# Patient Record
Sex: Male | Born: 1955 | Race: Black or African American | Hispanic: No | Marital: Married | Smoking: Former smoker
Health system: Southern US, Community
[De-identification: ages and names within clinical notes are randomized; demographics above are authoritative.]

## PROBLEM LIST (undated history)

## (undated) DIAGNOSIS — M1990 Unspecified osteoarthritis, unspecified site: Secondary | ICD-10-CM

## (undated) DIAGNOSIS — E785 Hyperlipidemia, unspecified: Secondary | ICD-10-CM

## (undated) DIAGNOSIS — I639 Cerebral infarction, unspecified: Secondary | ICD-10-CM

## (undated) DIAGNOSIS — I10 Essential (primary) hypertension: Secondary | ICD-10-CM

## (undated) DIAGNOSIS — R7303 Prediabetes: Secondary | ICD-10-CM

## (undated) DIAGNOSIS — Z89511 Acquired absence of right leg below knee: Secondary | ICD-10-CM

## (undated) DIAGNOSIS — G8929 Other chronic pain: Secondary | ICD-10-CM

## (undated) DIAGNOSIS — K219 Gastro-esophageal reflux disease without esophagitis: Secondary | ICD-10-CM

## (undated) DIAGNOSIS — D696 Thrombocytopenia, unspecified: Secondary | ICD-10-CM

## (undated) DIAGNOSIS — H9313 Tinnitus, bilateral: Secondary | ICD-10-CM

## (undated) HISTORY — PX: BELOW KNEE LEG AMPUTATION: SUR23

---

## 2006-03-13 ENCOUNTER — Ambulatory Visit: Payer: Self-pay | Admitting: Gastroenterology

## 2009-09-05 ENCOUNTER — Ambulatory Visit: Payer: Self-pay | Admitting: Internal Medicine

## 2009-09-10 ENCOUNTER — Ambulatory Visit: Payer: Self-pay | Admitting: Internal Medicine

## 2011-09-06 ENCOUNTER — Emergency Department (INDEPENDENT_AMBULATORY_CARE_PROVIDER_SITE_OTHER): Payer: No Typology Code available for payment source

## 2011-09-06 ENCOUNTER — Encounter: Payer: Self-pay | Admitting: *Deleted

## 2011-09-06 ENCOUNTER — Emergency Department (HOSPITAL_BASED_OUTPATIENT_CLINIC_OR_DEPARTMENT_OTHER)
Admission: EM | Admit: 2011-09-06 | Discharge: 2011-09-07 | Disposition: A | Discharge status: Home / Self Care | Payer: No Typology Code available for payment source | Attending: Emergency Medicine | Admitting: Emergency Medicine

## 2011-09-06 DIAGNOSIS — S20219A Contusion of unspecified front wall of thorax, initial encounter: Secondary | ICD-10-CM | POA: Insufficient documentation

## 2011-09-06 DIAGNOSIS — R079 Chest pain, unspecified: Secondary | ICD-10-CM

## 2011-09-06 DIAGNOSIS — Y92410 Unspecified street and highway as the place of occurrence of the external cause: Secondary | ICD-10-CM | POA: Insufficient documentation

## 2011-09-06 MED ORDER — IBUPROFEN 800 MG PO TABS
800.0000 mg | ORAL_TABLET | Freq: Once | ORAL | Status: AC
  Administered 2011-09-06: 800 mg via ORAL
  Filled 2011-09-06: qty 1

## 2011-09-06 MED ORDER — HYDROCODONE-ACETAMINOPHEN 5-325 MG PO TABS
1.0000 | ORAL_TABLET | Freq: Once | ORAL | Status: AC
  Administered 2011-09-06: 1 via ORAL
  Filled 2011-09-06: qty 1

## 2011-09-06 MED ORDER — HYDROCODONE-ACETAMINOPHEN 5-325 MG PO TABS: ORAL_TABLET | ORAL | Status: DC
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
## 2011-09-07 NOTE — ED Provider Notes (Signed)
Medical screening examination/treatment/procedure(s) were performed by non-physician practitioner and as supervising physician I was immediately available for consultation/collaboration.   Rolan Bucco, MD 09/07/11 [phone]

## 2012-04-15 ENCOUNTER — Ambulatory Visit: Payer: Self-pay | Admitting: Podiatry

## 2012-04-15 HISTORY — PX: OSTEOTOMY: SHX137

## 2012-08-16 ENCOUNTER — Ambulatory Visit: Payer: Self-pay | Admitting: Unknown Physician Specialty

## 2013-02-08 DIAGNOSIS — I639 Cerebral infarction, unspecified: Secondary | ICD-10-CM

## 2013-02-08 HISTORY — DX: Cerebral infarction, unspecified: I63.9

## 2013-03-15 ENCOUNTER — Ambulatory Visit: Payer: Self-pay | Admitting: Internal Medicine

## 2013-09-03 IMAGING — US US CAROTID DUPLEX BILAT
1 series · 14 of 24 positions shown · non-contrast
Comparison: none

REASON FOR EXAM: R sided weakness and R sided numbness in face eval for
CVA
COMMENTS:

PROCEDURE:     US  - US CAROTID DOPPLER BILATERAL  - March 15, 2013  [DATE]
RESULT:     Comparison: None
TECHNIQUE: Gray-scale, color Doppler, and spectral Doppler images were
obtained of the extracranial carotid artery systems and vertebral arteries
in the neck.

[Series 1: us carotid duplex bilat · 0.07mm/px · 14 of 70 slices shown]
[im 1/70]
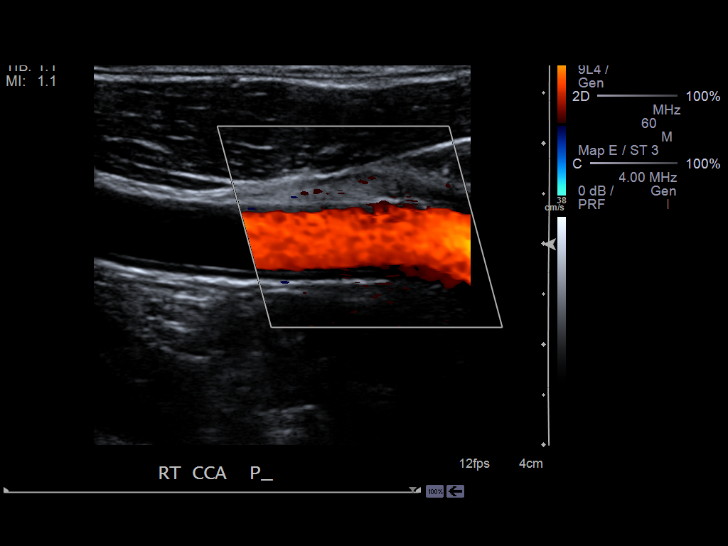
[im 7/70]
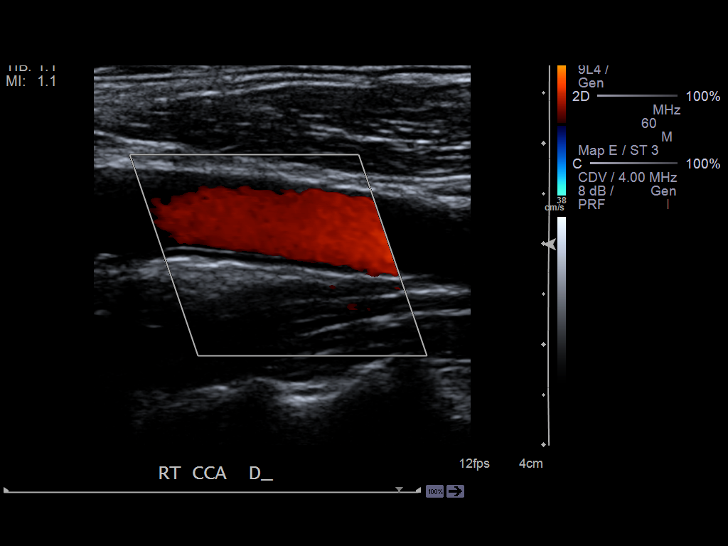
[im 13/70]
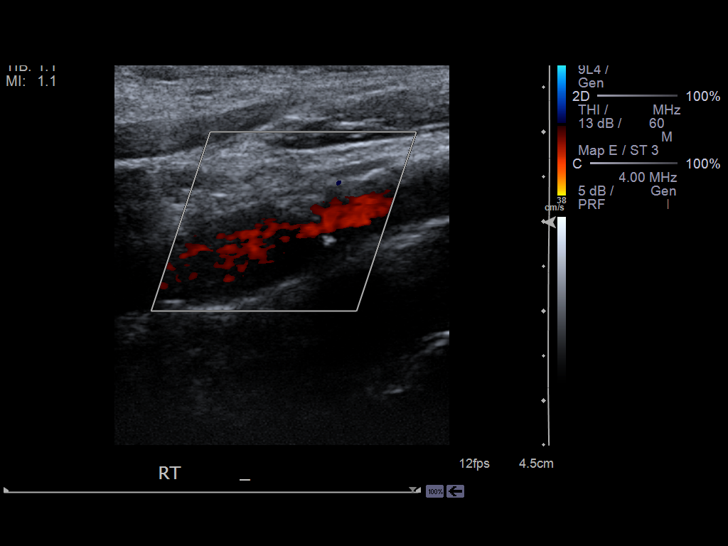
[im 19/70]
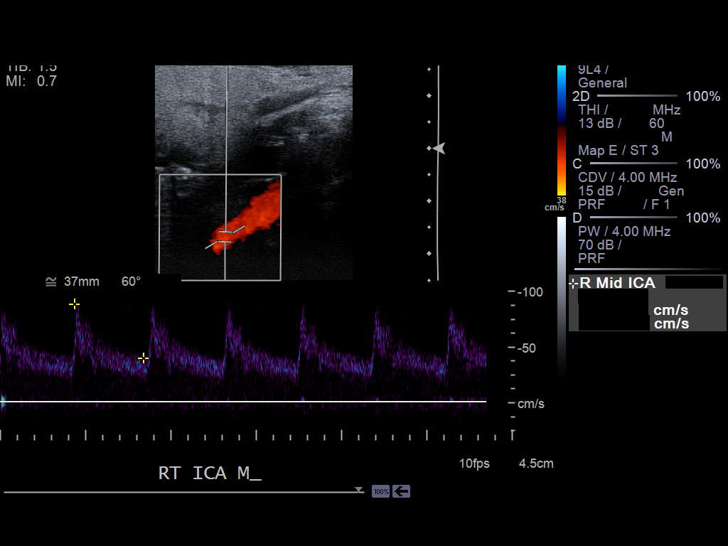
[im 22/70]
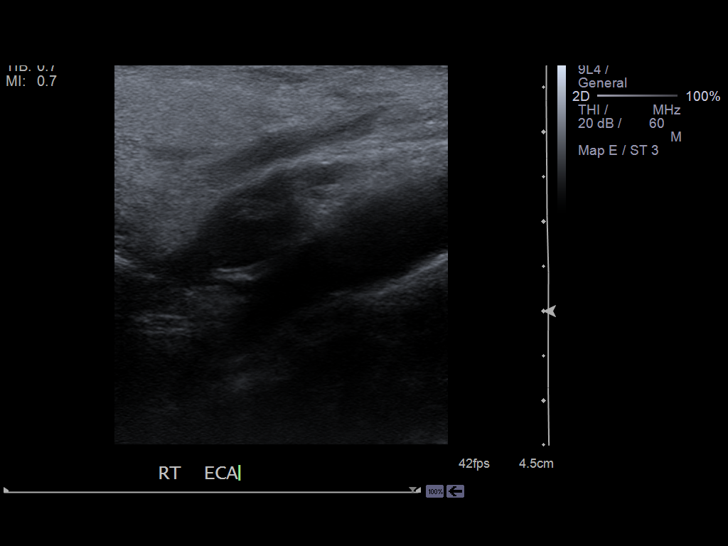
[im 28/70]
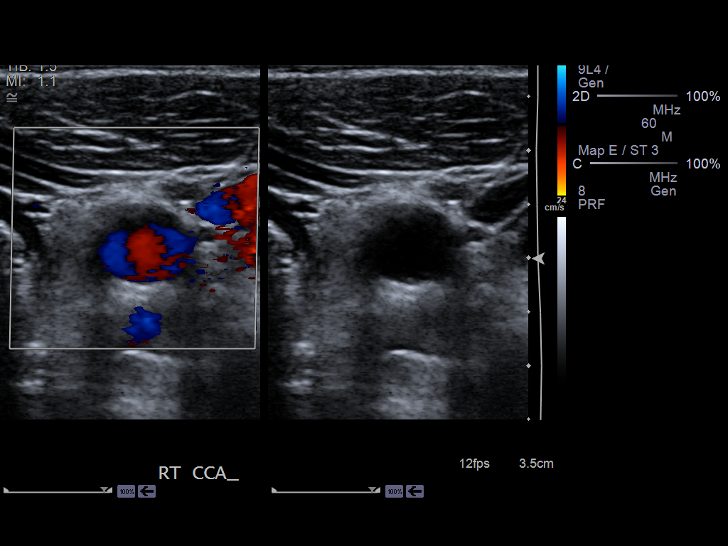
[im 34/70]
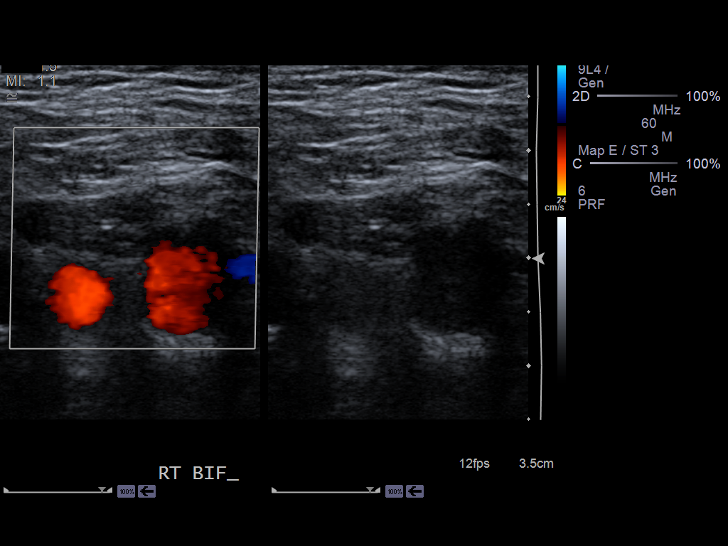
[im 37/70]
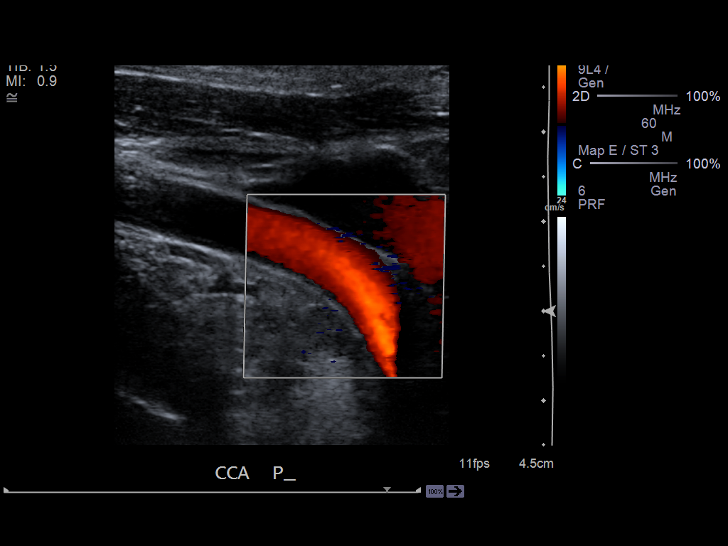
[im 43/70]
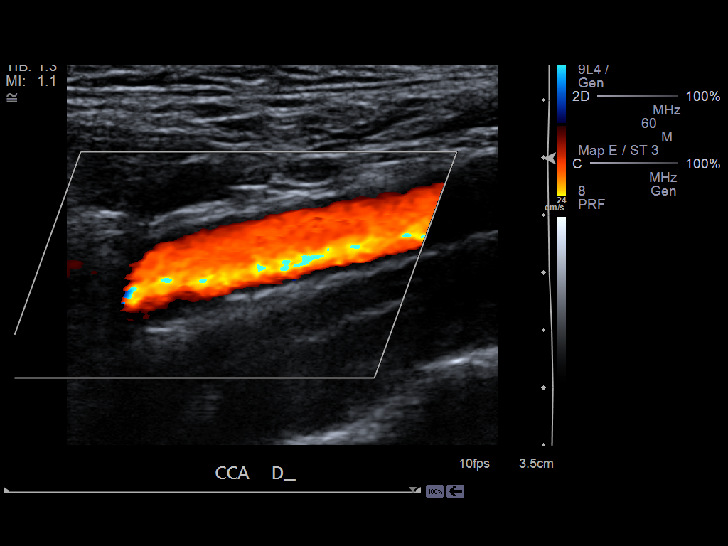
[im 49/70]
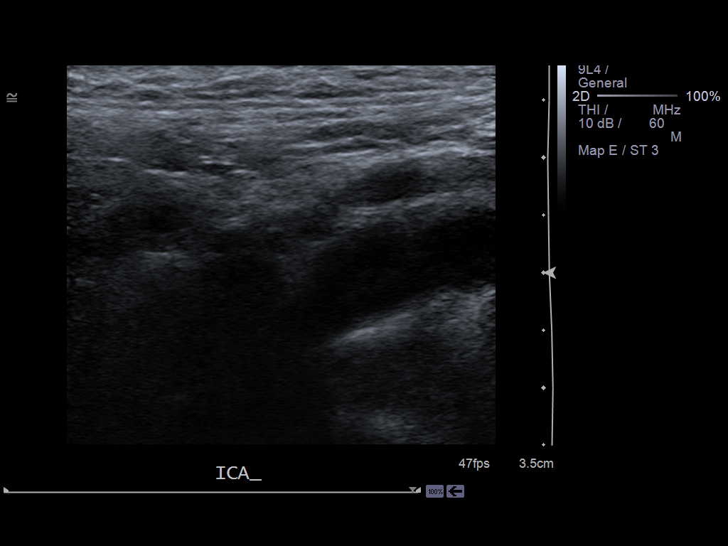
[im 55/70]
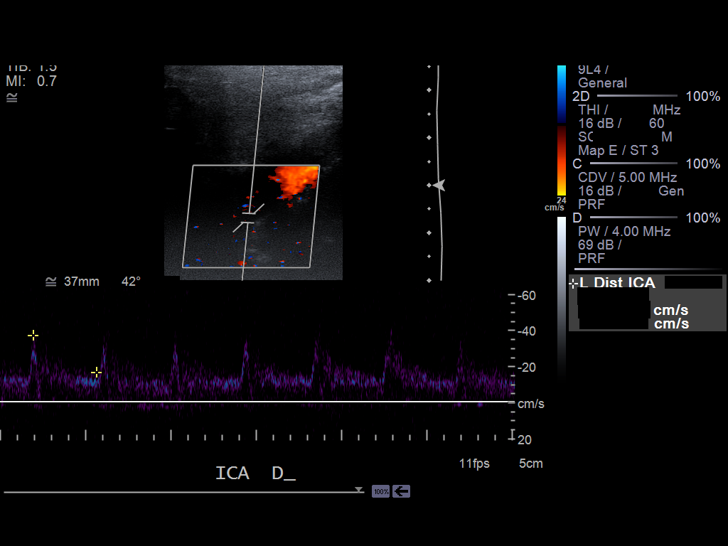
[im 58/70]
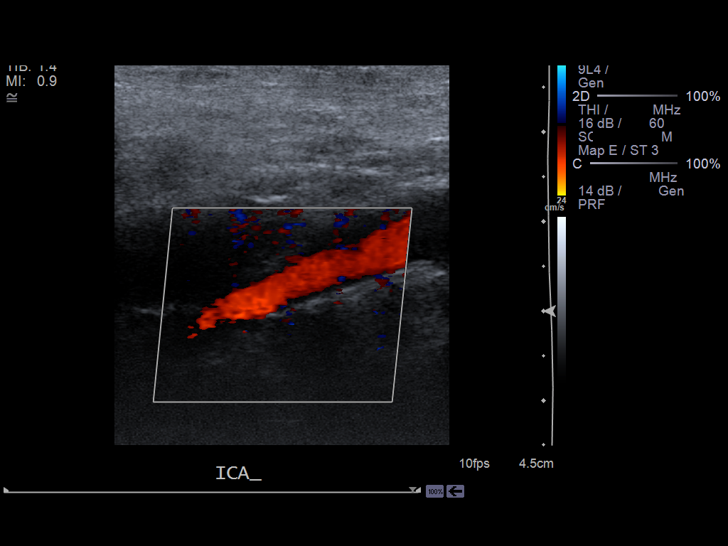
[im 64/70]
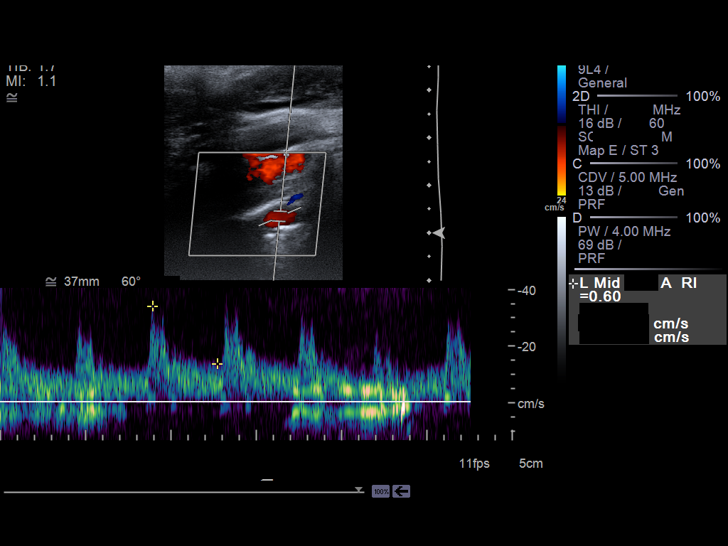
[im 70/70]
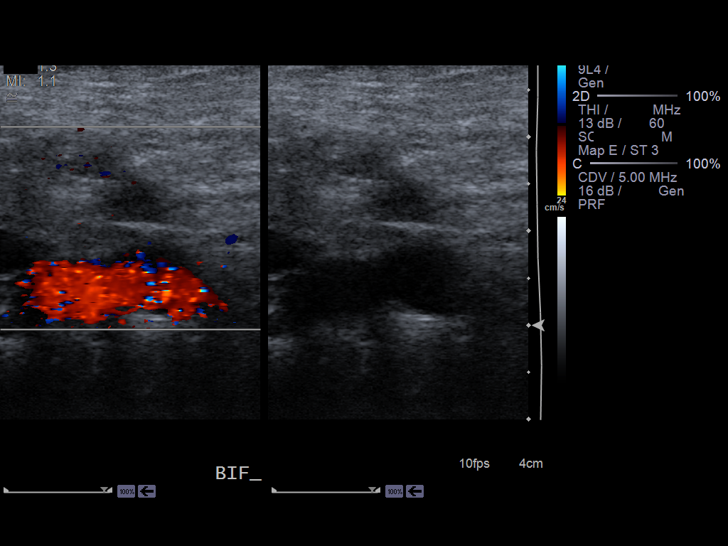

[14 of 24 positions shown; findings below may reference images not displayed]

FINDINGS: Mild atherosclerotic plaque is demonstrated in the carotid bulbs. By visual
inspection, there is less than 50% stenosis. The peak systolic velocities
are not elevated.

The vertebral arteries are patent bilaterally.
IMPRESSION: No evidence of hemodynamically significant stenosis in the extracranial
carotid arteries.

[REDACTED]

## 2017-01-16 ENCOUNTER — Encounter: Payer: Self-pay | Admitting: *Deleted

## 2017-01-19 ENCOUNTER — Ambulatory Visit
Admission: RE | Admit: 2017-01-19 | Discharge: 2017-01-19 | Disposition: A | Discharge status: Home / Self Care | Payer: BLUE CROSS/BLUE SHIELD | Source: Ambulatory Visit | Attending: Gastroenterology | Admitting: Gastroenterology

## 2017-01-19 ENCOUNTER — Ambulatory Visit: Payer: BLUE CROSS/BLUE SHIELD | Admitting: Anesthesiology

## 2017-01-19 ENCOUNTER — Encounter
Admission: RE | Disposition: A | Discharge status: Home / Self Care | Payer: Self-pay | Source: Ambulatory Visit | Attending: Gastroenterology

## 2017-01-19 ENCOUNTER — Encounter: Payer: Self-pay | Admitting: Anesthesiology

## 2017-01-19 DIAGNOSIS — K219 Gastro-esophageal reflux disease without esophagitis: Secondary | ICD-10-CM | POA: Insufficient documentation

## 2017-01-19 DIAGNOSIS — K5730 Diverticulosis of large intestine without perforation or abscess without bleeding: Secondary | ICD-10-CM | POA: Diagnosis not present

## 2017-01-19 DIAGNOSIS — Z1211 Encounter for screening for malignant neoplasm of colon: Secondary | ICD-10-CM | POA: Insufficient documentation

## 2017-01-19 DIAGNOSIS — I10 Essential (primary) hypertension: Secondary | ICD-10-CM | POA: Diagnosis not present

## 2017-01-19 DIAGNOSIS — D124 Benign neoplasm of descending colon: Secondary | ICD-10-CM | POA: Diagnosis not present

## 2017-01-19 DIAGNOSIS — Z87891 Personal history of nicotine dependence: Secondary | ICD-10-CM | POA: Insufficient documentation

## 2017-01-19 DIAGNOSIS — Z79899 Other long term (current) drug therapy: Secondary | ICD-10-CM | POA: Diagnosis not present

## 2017-01-19 DIAGNOSIS — Z7982 Long term (current) use of aspirin: Secondary | ICD-10-CM | POA: Diagnosis not present

## 2017-01-19 DIAGNOSIS — K648 Other hemorrhoids: Secondary | ICD-10-CM | POA: Insufficient documentation

## 2017-01-19 DIAGNOSIS — E785 Hyperlipidemia, unspecified: Secondary | ICD-10-CM | POA: Diagnosis not present

## 2017-01-19 DIAGNOSIS — M1990 Unspecified osteoarthritis, unspecified site: Secondary | ICD-10-CM | POA: Diagnosis not present

## 2017-01-19 DIAGNOSIS — Z8673 Personal history of transient ischemic attack (TIA), and cerebral infarction without residual deficits: Secondary | ICD-10-CM | POA: Insufficient documentation

## 2017-01-19 HISTORY — PX: COLONOSCOPY WITH PROPOFOL: SHX5780

## 2017-01-19 HISTORY — DX: Essential (primary) hypertension: I10

## 2017-01-19 HISTORY — DX: Hyperlipidemia, unspecified: E78.5

## 2017-01-19 HISTORY — DX: Cerebral infarction, unspecified: I63.9

## 2017-01-19 HISTORY — DX: Unspecified osteoarthritis, unspecified site: M19.90

## 2017-01-19 HISTORY — DX: Gastro-esophageal reflux disease without esophagitis: K21.9

## 2017-01-19 SURGERY — COLONOSCOPY WITH PROPOFOL
Anesthesia: General

## 2017-01-19 MED ORDER — LIDOCAINE HCL (PF) 1 % IJ SOLN
2.0000 mL | Freq: Once | INTRAMUSCULAR | Status: AC
  Administered 2017-01-19: 0.3 mL via INTRADERMAL

## 2017-01-19 MED ORDER — FENTANYL CITRATE (PF) 100 MCG/2ML IJ SOLN: 25.0000 ug | INTRAMUSCULAR | Status: DC | PRN

## 2017-01-19 MED ORDER — SODIUM CHLORIDE 0.9 % IV SOLN: INTRAVENOUS | Status: DC

## 2017-01-19 MED ORDER — SODIUM CHLORIDE 0.9 % IV SOLN
INTRAVENOUS | Status: DC
  Administered 2017-01-19: 1000 mL via INTRAVENOUS

## 2017-01-19 MED ORDER — PROPOFOL 10 MG/ML IV BOLUS
INTRAVENOUS | Status: DC | PRN
  Administered 2017-01-19: 80 mg via INTRAVENOUS

## 2017-01-19 MED ORDER — MIDAZOLAM HCL 2 MG/2ML IJ SOLN
INTRAMUSCULAR | Status: DC | PRN
  Administered 2017-01-19: 1 mg via INTRAVENOUS

## 2017-01-19 MED ORDER — MIDAZOLAM HCL 2 MG/2ML IJ SOLN
INTRAMUSCULAR | Status: AC
  Filled 2017-01-19: qty 2

## 2017-01-19 MED ORDER — LIDOCAINE HCL (PF) 2 % IJ SOLN
INTRAMUSCULAR | Status: DC | PRN
Start: 2017-01-19 — End: 2017-01-19
  Administered 2017-01-19: 40 mg via INTRADERMAL

## 2017-01-19 MED ORDER — PROPOFOL 500 MG/50ML IV EMUL
INTRAVENOUS | Status: DC | PRN
  Administered 2017-01-19: 150 ug/kg/min via INTRAVENOUS

## 2017-01-19 MED ORDER — SODIUM CHLORIDE 0.9 % IJ SOLN
INTRAMUSCULAR | Status: AC
  Filled 2017-01-19: qty 10

## 2017-01-19 MED ORDER — EPHEDRINE SULFATE 50 MG/ML IJ SOLN
INTRAMUSCULAR | Status: DC | PRN
  Administered 2017-01-19: 5 mg via INTRAVENOUS

## 2017-01-19 MED ORDER — PROPOFOL 500 MG/50ML IV EMUL
INTRAVENOUS | Status: AC
  Filled 2017-01-19: qty 50

## 2017-01-19 MED ORDER — LIDOCAINE HCL (PF) 1 % IJ SOLN
INTRAMUSCULAR | Status: AC
  Administered 2017-01-19: 0.3 mL via INTRADERMAL
  Filled 2017-01-19: qty 2

## 2017-01-19 MED ORDER — PROPOFOL 500 MG/50ML IV EMUL
INTRAVENOUS | Status: AC
Start: 2017-01-19 — End: 2017-01-19
  Filled 2017-01-19: qty 50

## 2017-01-19 MED ORDER — ONDANSETRON HCL 4 MG/2ML IJ SOLN: 4.0000 mg | Freq: Once | INTRAMUSCULAR | Status: DC | PRN

## 2017-01-19 NOTE — H&P (Signed)
Outpatient short stay form Pre-procedure 01/19/2017 11:54 AM Lollie Sails MD  Primary Physician:   Reason for visit:    History of present illness:    Current Facility-Administered Medications:  .  0.9 %  sodium chloride infusion, , Intravenous, Continuous, Lollie Sails, MD  Prescriptions Prior to Admission  Medication Sig Dispense Refill Last Dose  . aspirin EC 81 MG tablet Take 81 mg by mouth daily.     Marland Kitchen atorvastatin (LIPITOR) 10 MG tablet Take 10 mg by mouth daily at 6 PM.     . meloxicam (MOBIC) 15 MG tablet Take 15 mg by mouth daily. As needed     . ranitidine (ZANTAC) 300 MG capsule Take 300 mg by mouth daily.     Marland Kitchen esomeprazole (NEXIUM) 40 MG capsule Take 40 mg by mouth daily as needed. For acid reflux    09/06/2011 at Unknown  . HYDROcodone-acetaminophen (NORCO) 5-325 MG per tablet One po q 4-6 hrs prn pain 20 tablet 0   . ibuprofen (ADVIL,MOTRIN) 600 MG tablet Take 600 mg by mouth every 6 (six) hours as needed. For pain    Past Week at Unknown  . ibuprofen (ADVIL,MOTRIN) 800 MG tablet Take 800 mg by mouth every 8 (eight) hours as needed. For pain    Past Week at Unknown     No Known Allergies   Past Medical History:  Diagnosis Date  . CVA (cerebral vascular accident) ([place]) 02/2013   Slight right lacunar CVA manifested by left upper and lower exteremity paresthesias. MRI of brain showed small vessel disease only. No obvious CVA. Ultrasound of carotids showed insignificant disease.  Marland Kitchen GERD (gastroesophageal reflux disease)   . Hyperlipidemia   . Hypertension   . Osteoarthritis    Degenerative arthritis medial compartment of the left knee    Review of systems:      Physical Exam    Heart and lungs    HEENT    Other    Pertinant exam for procedure:     Planned proceedures: See other note this date for H&P    Lollie Sails, MD Gastroenterology 01/19/2017  11:54 AM

## 2017-01-19 NOTE — Anesthesia Postprocedure Evaluation (Signed)
Anesthesia Post Note  Patient: Juan Scott  Procedure(s) Performed: Procedure(s) (LRB): COLONOSCOPY WITH PROPOFOL (N/A)  Patient location during evaluation: PACU Anesthesia Type: General Level of consciousness: awake and alert and oriented Pain management: pain level controlled Vital Signs Assessment: post-procedure vital signs reviewed and stable Respiratory status: spontaneous breathing Cardiovascular status: blood pressure returned to baseline Anesthetic complications: no     Last Vitals:  Vitals:   01/19/17 1348 01/19/17 1358  BP: 122/82 121/77  Pulse: 75 (!) 59  Resp: 19 12  Temp:      Last Pain:  Vitals:   01/19/17 1329  TempSrc: Tympanic  PainSc:                  Tanaysha Alkins

## 2017-01-19 NOTE — Anesthesia Post-op Follow-up Note (Cosign Needed)
Anesthesia QCDR form completed.        

## 2017-01-19 NOTE — Op Note (Signed)
[facility] Gastroenterology Patient Name: Juan Scott Procedure Date: 01/19/2017 12:24 PM MRN: [id number] Account #: [id number] Date of Birth: 12-03-1955 Admit Type: Outpatient Age: 61 Room: [facility] ENDO ROOM 1 Gender: Male Note Status: Finalized Procedure:            Colonoscopy Indications:          Screening for colorectal malignant neoplasm Providers:            Lollie Sails, MD Referring MD:         Caprice Renshaw MD (Referring MD) Medicines:            Monitored Anesthesia Care Complications:        No immediate complications. Procedure:            Pre-Anesthesia Assessment:                       - ASA Grade Assessment: III - A patient with severe                        systemic disease.                       After obtaining informed consent, the colonoscope was                        passed under direct vision. Throughout the procedure,                        the patient's blood pressure, pulse, and oxygen                        saturations were monitored continuously. The                        Colonoscope was introduced through the anus and                        advanced to the the cecum, identified by appendiceal                        orifice and ileocecal valve. The colonoscopy was                        performed without difficulty. The patient tolerated the                        procedure well. The quality of the bowel preparation                        was good. Findings:      Multiple medium-mouthed diverticula were found in the ascending colon.      A 16 mm polyp was found in the descending colon. The polyp was       pedunculated. The polyp was removed with a hot snare. Resection and       retrieval ( with a net) were complete. To prevent bleeding after the       polypectomy, one hemostatic clip was successfully placed. There was no       bleeding at the end of the maneuver.      Non-bleeding internal hemorrhoids were found during  anoscopy.  The retroflexed view of the distal rectum and anal verge was normal and       showed no anal or rectal abnormalities.      The digital rectal exam was normal. Impression:           - Diverticulosis in the ascending colon.                       - One 16 mm polyp in the descending colon, removed with                        a hot snare. Resected and retrieved.                       - Non-bleeding internal hemorrhoids.                       - The distal rectum and anal verge are normal on                        retroflexion view. Recommendation:       - Discharge patient to home.                       - Await pathology results. Procedure Code(s):    --- Professional ---                       [phone], Colonoscopy, flexible; with removal of tumor(s),                        polyp(s), or other lesion(s) by snare technique Diagnosis Code(s):    --- Professional ---                       Z12.11, Encounter for screening for malignant neoplasm                        of colon                       K64.8, Other hemorrhoids                       D12.4, Benign neoplasm of descending colon                       K57.30, Diverticulosis of large intestine without                        perforation or abscess without bleeding CPT copyright 2016 American Medical Association. All rights reserved. The codes documented in this report are preliminary and upon coder review may  be revised to meet current compliance requirements. Lollie Sails, MD 01/19/2017 1:27:59 PM This report has been signed electronically. Number of Addenda: 0 Note Initiated On: 01/19/2017 12:24 PM Scope Withdrawal Time: 0 hours 24 minutes 29 seconds  Total Procedure Duration: 0 hours 33 minutes 16 seconds       [facility]

## 2017-01-19 NOTE — Transfer of Care (Signed)
Immediate Anesthesia Transfer of Care Note  Patient: Juan Scott  Procedure(s) Performed: Procedure(s): COLONOSCOPY WITH PROPOFOL (N/A)  Patient Location: PACU  Anesthesia Type:General  Level of Consciousness: awake, alert  and oriented  Airway & Oxygen Therapy: Patient Spontanous Breathing and Patient connected to nasal cannula oxygen  Post-op Assessment: Report given to RN and Post -op Vital signs reviewed and stable  Post vital signs: Reviewed and stable  Last Vitals:  Vitals:   01/19/17 1328 01/19/17 1329  BP: 115/68 115/68  Pulse: 69 66  Resp: (!) 22 (!) 23  Temp: 36.4 C (!) 35.8 C    Last Pain:  Vitals:   01/19/17 1329  TempSrc: Tympanic  PainSc:          Complications: No apparent anesthesia complications

## 2017-01-19 NOTE — H&P (Signed)
Outpatient short stay form Pre-procedure 01/19/2017 12:41 PM Lollie Sails MD  Primary Physician: Dr Derinda Late  Reason for visit:  Colonoscopy  History of present illness:  Patient is a 61 year old male presenting today as above. He tolerated his prep well. He takes no aspirin with the exception of 81 mg aspirin. He takes no blood thinning agents.    Current Facility-Administered Medications:  .  0.9 %  sodium chloride infusion, , Intravenous, Continuous, Lollie Sails, MD .  0.9 %  sodium chloride infusion, , Intravenous, Continuous, Lollie Sails, MD, Last Rate: 20 mL/hr at 01/19/17 1220, 1,000 mL at 01/19/17 1220 .  0.9 %  sodium chloride infusion, , Intravenous, Continuous, Lollie Sails, MD .  fentaNYL (SUBLIMAZE) injection 25 mcg, 25 mcg, Intravenous, Q5 min PRN, Alvin Critchley, MD .  ondansetron [facility]) injection 4 mg, 4 mg, Intravenous, Once PRN, Alvin Critchley, MD  Facility-Administered Medications Ordered in Other Encounters:  .  lidocaine (XYLOCAINE) 2 % injection, , , Anesthesia Intra-op, Hedda Slade, CRNA, 40 mg at 01/19/17 1241  Prescriptions Prior to Admission  Medication Sig Dispense Refill Last Dose  . aspirin EC 81 MG tablet Take 81 mg by mouth daily.   Past Month at Unknown time  . atorvastatin (LIPITOR) 10 MG tablet Take 10 mg by mouth daily at 6 PM.     . meloxicam (MOBIC) 15 MG tablet Take 15 mg by mouth daily. As needed     . ranitidine (ZANTAC) 300 MG capsule Take 300 mg by mouth daily.     Marland Kitchen esomeprazole (NEXIUM) 40 MG capsule Take 40 mg by mouth daily as needed. For acid reflux    09/06/2011 at Unknown  . HYDROcodone-acetaminophen (NORCO) 5-325 MG per tablet One po q 4-6 hrs prn pain 20 tablet 0   . ibuprofen (ADVIL,MOTRIN) 600 MG tablet Take 600 mg by mouth every 6 (six) hours as needed. For pain    01/13/2017  . ibuprofen (ADVIL,MOTRIN) 800 MG tablet Take 800 mg by mouth every 8 (eight) hours as needed. For pain    Past Week at Unknown      No Known Allergies   Past Medical History:  Diagnosis Date  . CVA (cerebral vascular accident) ([place]) 02/2013   Slight right lacunar CVA manifested by left upper and lower exteremity paresthesias. MRI of brain showed small vessel disease only. No obvious CVA. Ultrasound of carotids showed insignificant disease.  Marland Kitchen GERD (gastroesophageal reflux disease)   . Hyperlipidemia   . Hypertension   . Osteoarthritis    Degenerative arthritis medial compartment of the left knee    Review of systems:      Physical Exam    Heart and lungs: Regular rate and rhythm without rub or gallop, lungs are bilaterally clear.    HEENT: Normocephalic atraumatic eyes are anicteric    Other:     Pertinant exam for procedure: Soft nontender nondistended bowel sounds positive normoactive. It is of note that he has a palpable liver edge that extends about 2 cm below the costal margin. This is nontender.    Planned proceedures: Colonoscopy and indicated procedures.. It is of note that on physical examination he has a palpable liver edge. Review of his laboratories indicate occasional minimal elevations of bilirubin with normal transaminases. Will recommend a abdominal ultrasound and will schedule this as an outpatient with follow-up. Lollie Sails, MD Gastroenterology 01/19/2017  12:41 PM

## 2017-01-19 NOTE — Anesthesia Preprocedure Evaluation (Signed)
Anesthesia Evaluation  Patient identified by MRN, date of birth, ID band Patient awake    Reviewed: Allergy & Precautions, NPO status , Patient's Chart, lab work & pertinent test results  Airway Mallampati: II       Dental   Pulmonary former smoker,    Pulmonary exam normal        Cardiovascular hypertension, Normal cardiovascular exam     Neuro/Psych CVA negative psych ROS   GI/Hepatic Neg liver ROS, GERD  Medicated,  Endo/Other  negative endocrine ROS  Renal/GU negative Renal ROS     Musculoskeletal  (+) Arthritis , Osteoarthritis,    Abdominal Normal abdominal exam  (+)   Peds negative pediatric ROS (+)  Hematology negative hematology ROS (+)   Anesthesia Other Findings   Reproductive/Obstetrics                             Anesthesia Physical Anesthesia Plan  ASA: III  Anesthesia Plan: General   Post-op Pain Management:    Induction: Intravenous  Airway Management Planned: Nasal Cannula  Additional Equipment:   Intra-op Plan:   Post-operative Plan:   Informed Consent: I have reviewed the patients History and Physical, chart, labs and discussed the procedure including the risks, benefits and alternatives for the proposed anesthesia with the patient or authorized representative who has indicated his/her understanding and acceptance.   Dental advisory given  Plan Discussed with: CRNA and Surgeon  Anesthesia Plan Comments:         Anesthesia Quick Evaluation

## 2017-01-20 LAB — SURGICAL PATHOLOGY

## 2017-01-21 ENCOUNTER — Other Ambulatory Visit: Payer: Self-pay | Admitting: Gastroenterology

## 2017-01-21 DIAGNOSIS — R17 Unspecified jaundice: Secondary | ICD-10-CM

## 2017-01-29 ENCOUNTER — Ambulatory Visit: Payer: BLUE CROSS/BLUE SHIELD

## 2017-02-02 ENCOUNTER — Ambulatory Visit
Admission: RE | Admit: 2017-02-02 | Discharge: 2017-02-02 | Disposition: A | Discharge status: Home / Self Care | Payer: BLUE CROSS/BLUE SHIELD | Source: Ambulatory Visit | Attending: Gastroenterology | Admitting: Gastroenterology

## 2017-02-02 DIAGNOSIS — R17 Unspecified jaundice: Secondary | ICD-10-CM | POA: Insufficient documentation

## 2017-02-02 DIAGNOSIS — I77811 Abdominal aortic ectasia: Secondary | ICD-10-CM | POA: Diagnosis not present

## 2017-02-02 DIAGNOSIS — K8020 Calculus of gallbladder without cholecystitis without obstruction: Secondary | ICD-10-CM | POA: Diagnosis not present

## 2018-09-22 IMAGING — US US ABDOMEN COMPLETE
1 series · 14 of 25 positions shown · non-contrast
Comparison: No recent prior.

CLINICAL DATA: Elevated bilirubin.

EXAM:
ABDOMEN ULTRASOUND COMPLETE

[Series 1: us abdomen complete · 0.23mm/px · 14 of 100 slices shown]
[im 1/100]
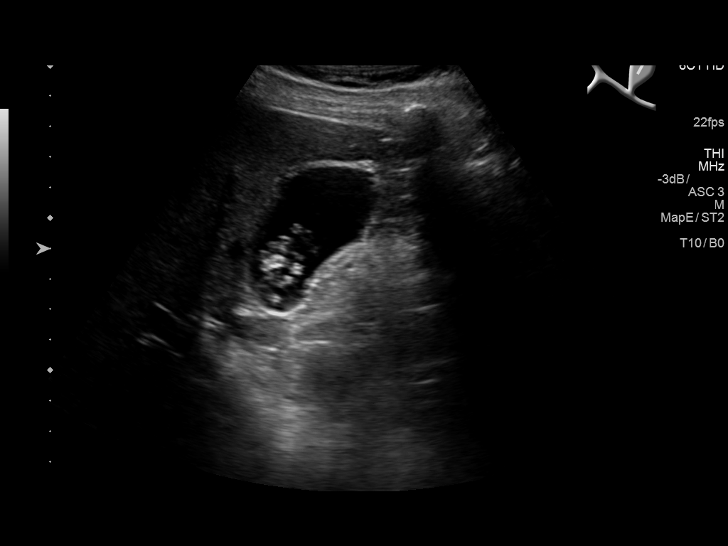
[im 9/100]
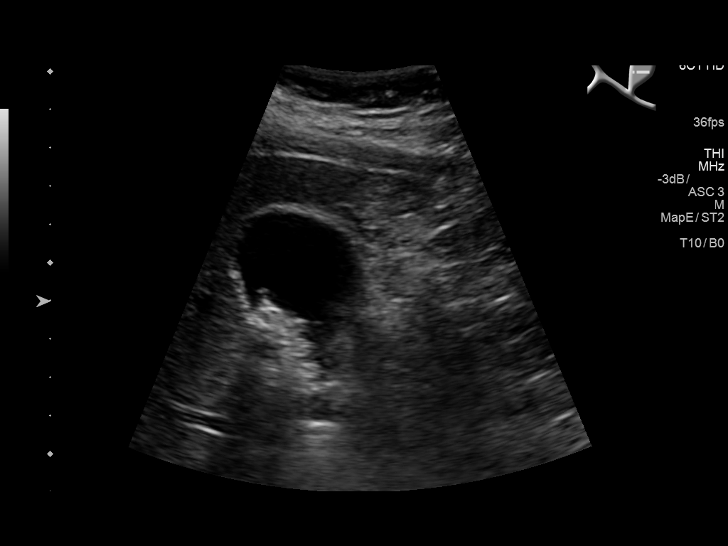
[im 17/100]
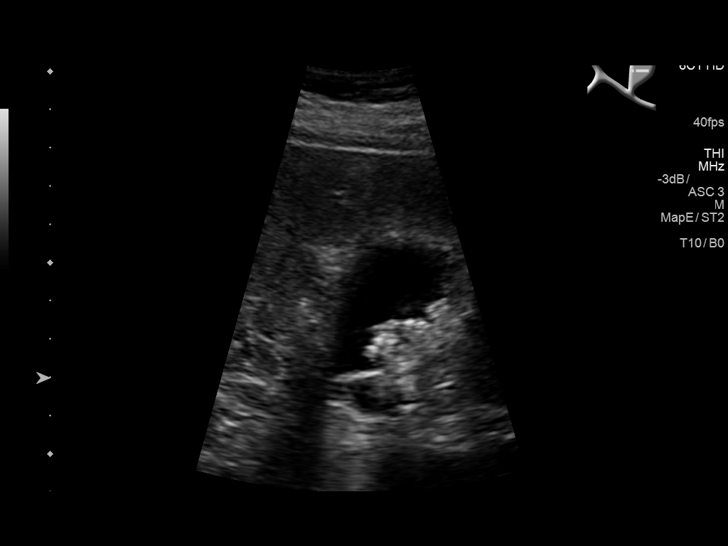
[im 25/100]
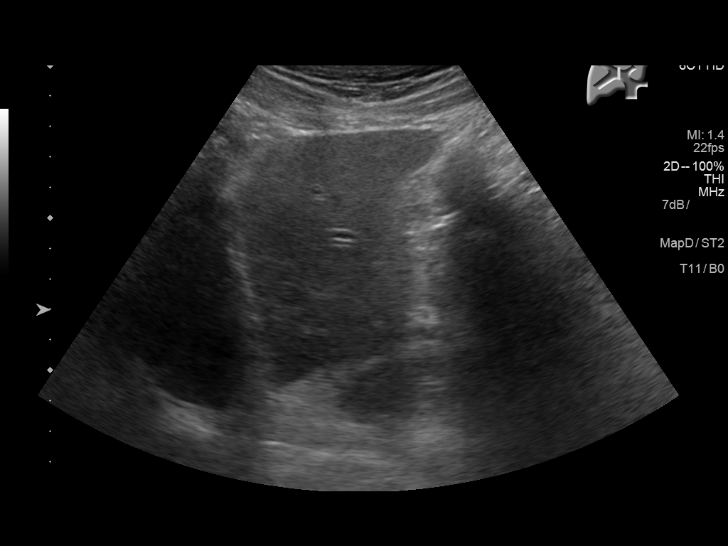
[im 34/100]
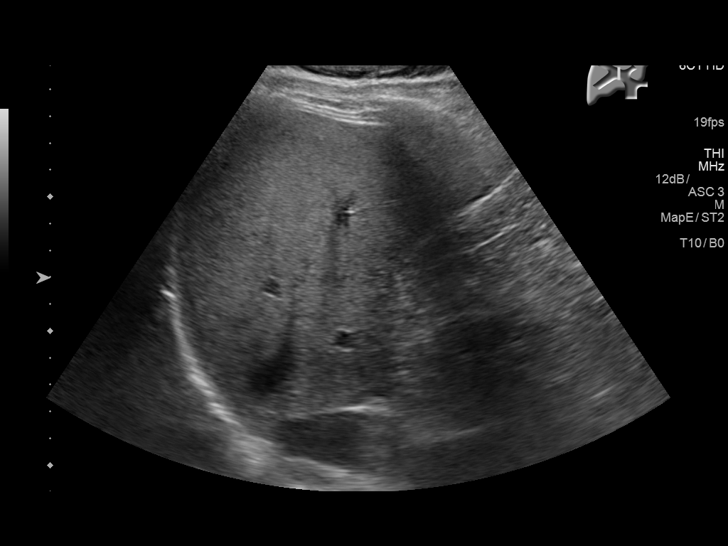
[im 38/100]
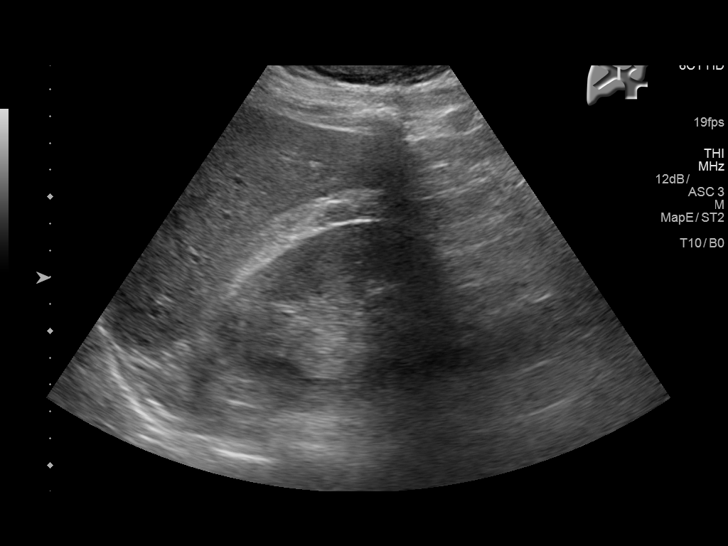
[im 46/100]
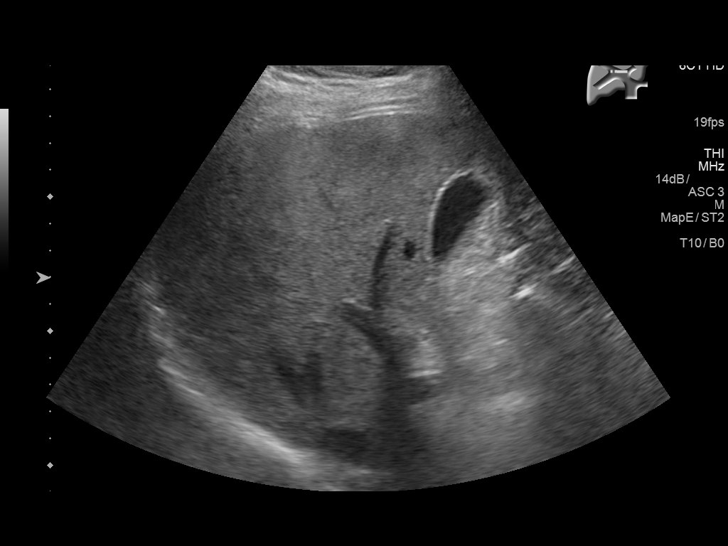
[im 54/100]
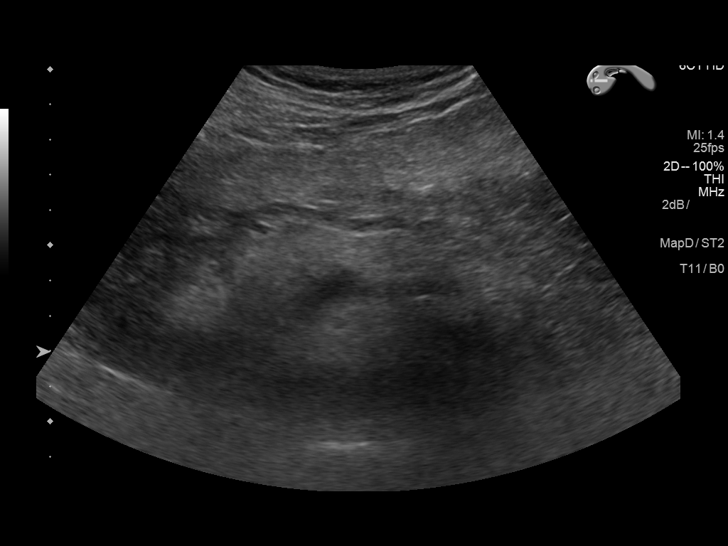
[im 62/100]
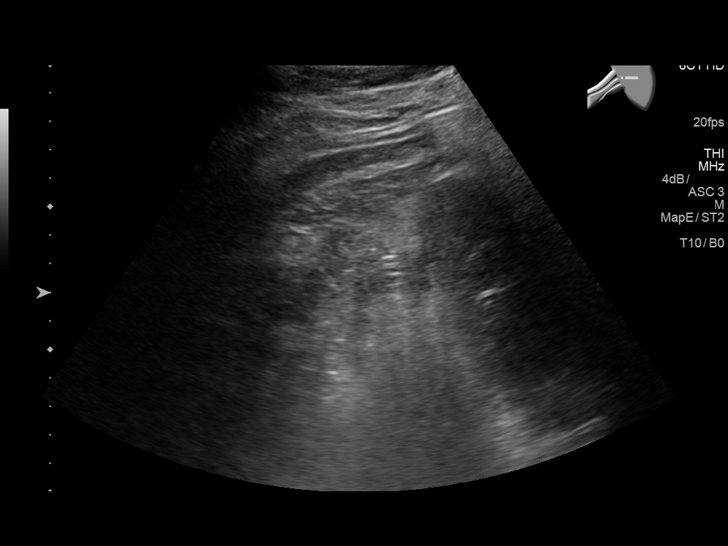
[im 67/100]
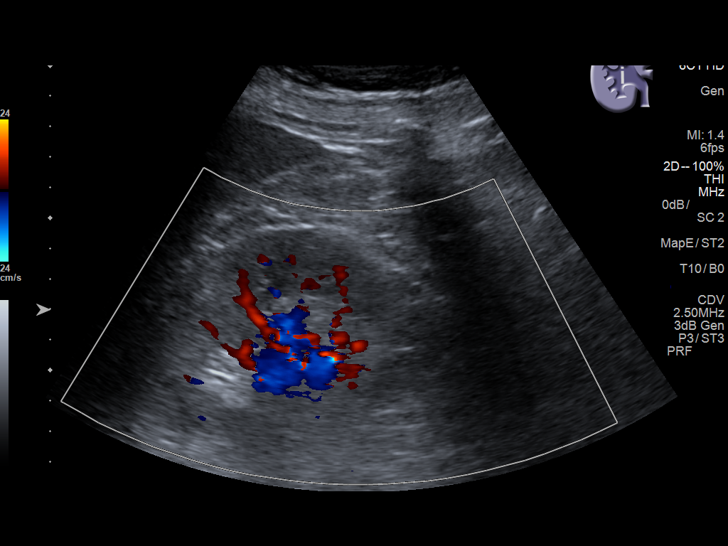
[im 75/100]
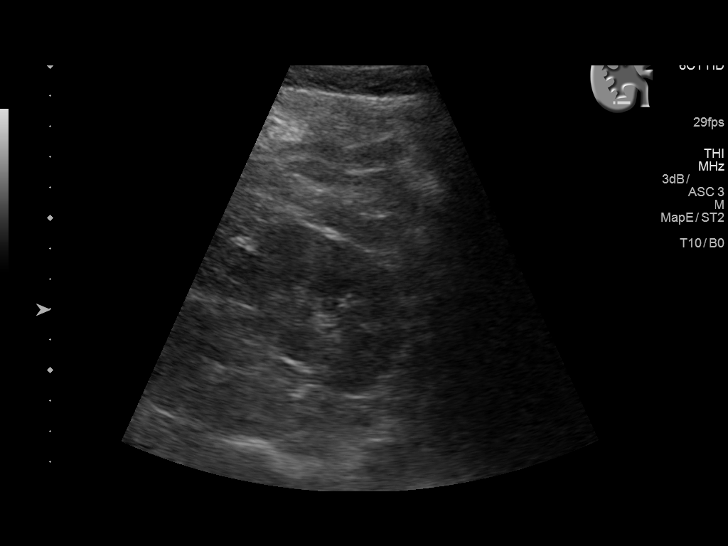
[im 83/100]
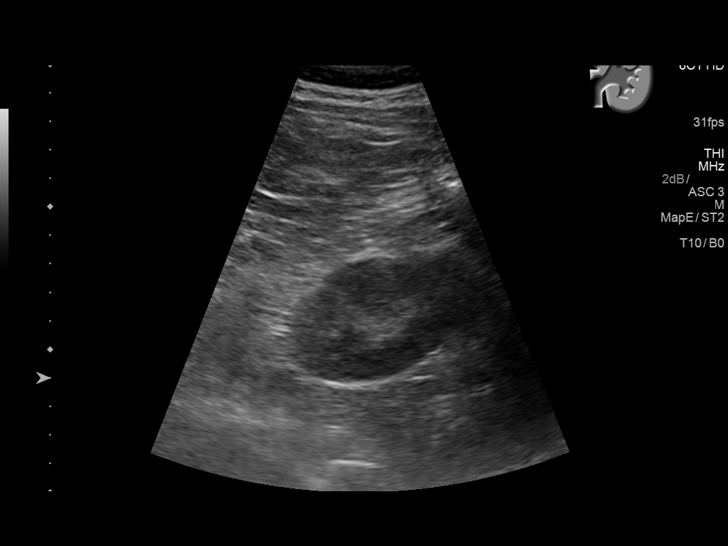
[im 91/100]
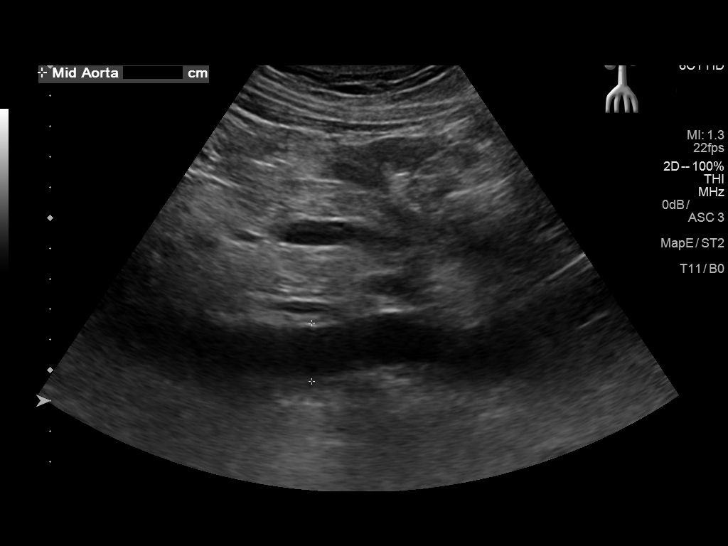
[im 100/100]
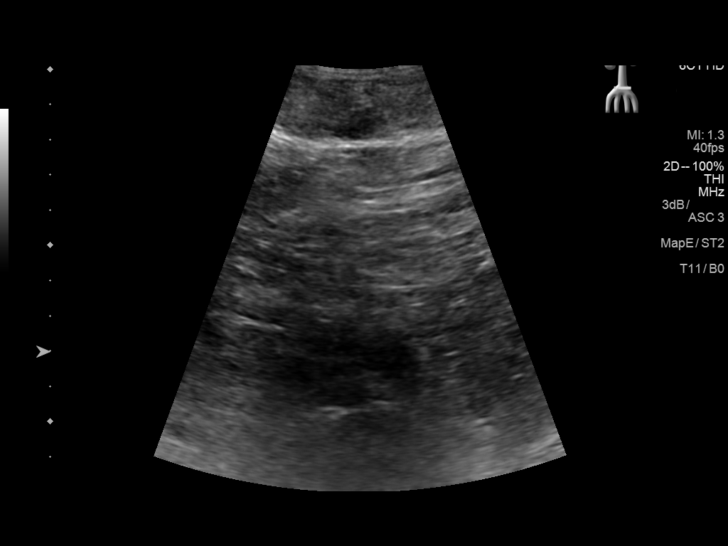

[14 of 25 positions shown; findings below may reference images not displayed]

FINDINGS: Gallbladder: Multiple gallstones. Gallbladder wall thickness normal.
No sonographic Murphy sign noted by sonographer.

Common bile duct: Diameter: 3.8 mm

Liver: No focal lesion identified. Within normal limits in
parenchymal echogenicity.

IVC: No abnormality visualized.

Pancreas: Visualized portion unremarkable.

Spleen: Size and appearance within normal limits.

Right Kidney: Length: 11.6 cm. Echogenicity within normal limits. No
mass or hydronephrosis visualized.

Left Kidney: Length: 10.8 cm. Echogenicity within normal limits. No
mass or hydronephrosis visualized.

Abdominal aorta: No aneurysm visualized.

Other findings: Abdominal aortic ectasia 2.6 cm.
IMPRESSION: 1. Multiple gallstones . No evidence of cholecystitis or biliary
distention.

2. Abdominal aortic ectasia at 2.6 cm. Ectatic abdominal aorta at
risk for aneurysm development. Recommend followup by ultrasound in 5
years. This recommendation follows ACR consensus guidelines: White
Paper of the ACR Incidental Findings Committee II on Vascular
Findings. [HOSPITAL] 5139; [DATE].

## 2019-06-30 ENCOUNTER — Other Ambulatory Visit: Payer: Self-pay

## 2019-06-30 ENCOUNTER — Other Ambulatory Visit
Admission: RE | Admit: 2019-06-30 | Discharge: 2019-06-30 | Disposition: A | Discharge status: Home / Self Care | Payer: Medicare Other | Source: Ambulatory Visit | Attending: Gastroenterology | Admitting: Gastroenterology

## 2019-06-30 DIAGNOSIS — Z01812 Encounter for preprocedural laboratory examination: Secondary | ICD-10-CM | POA: Diagnosis present

## 2019-06-30 DIAGNOSIS — Z20828 Contact with and (suspected) exposure to other viral communicable diseases: Secondary | ICD-10-CM | POA: Insufficient documentation

## 2019-06-30 LAB — SARS CORONAVIRUS 2 (TAT 6-24 HRS): SARS Coronavirus 2: NEGATIVE

## 2019-07-01 ENCOUNTER — Encounter: Payer: Self-pay | Admitting: *Deleted

## 2019-07-04 ENCOUNTER — Ambulatory Visit: Payer: Medicare Other | Admitting: Certified Registered Nurse Anesthetist

## 2019-07-04 ENCOUNTER — Encounter
Admission: RE | Disposition: A | Discharge status: Home / Self Care | Payer: Self-pay | Source: Home / Self Care | Attending: Gastroenterology

## 2019-07-04 ENCOUNTER — Ambulatory Visit
Admission: RE | Admit: 2019-07-04 | Discharge: 2019-07-04 | Disposition: A | Discharge status: Home / Self Care | Payer: Medicare Other | Attending: Gastroenterology | Admitting: Gastroenterology

## 2019-07-04 DIAGNOSIS — Z8673 Personal history of transient ischemic attack (TIA), and cerebral infarction without residual deficits: Secondary | ICD-10-CM | POA: Insufficient documentation

## 2019-07-04 DIAGNOSIS — Z1211 Encounter for screening for malignant neoplasm of colon: Secondary | ICD-10-CM | POA: Insufficient documentation

## 2019-07-04 DIAGNOSIS — D123 Benign neoplasm of transverse colon: Secondary | ICD-10-CM | POA: Diagnosis not present

## 2019-07-04 DIAGNOSIS — M1990 Unspecified osteoarthritis, unspecified site: Secondary | ICD-10-CM | POA: Insufficient documentation

## 2019-07-04 DIAGNOSIS — K5730 Diverticulosis of large intestine without perforation or abscess without bleeding: Secondary | ICD-10-CM | POA: Insufficient documentation

## 2019-07-04 DIAGNOSIS — I10 Essential (primary) hypertension: Secondary | ICD-10-CM | POA: Diagnosis not present

## 2019-07-04 DIAGNOSIS — K640 First degree hemorrhoids: Secondary | ICD-10-CM | POA: Diagnosis not present

## 2019-07-04 DIAGNOSIS — Z87891 Personal history of nicotine dependence: Secondary | ICD-10-CM | POA: Insufficient documentation

## 2019-07-04 DIAGNOSIS — E785 Hyperlipidemia, unspecified: Secondary | ICD-10-CM | POA: Diagnosis not present

## 2019-07-04 DIAGNOSIS — Z79899 Other long term (current) drug therapy: Secondary | ICD-10-CM | POA: Diagnosis not present

## 2019-07-04 DIAGNOSIS — K219 Gastro-esophageal reflux disease without esophagitis: Secondary | ICD-10-CM | POA: Diagnosis not present

## 2019-07-04 HISTORY — PX: COLONOSCOPY WITH PROPOFOL: SHX5780

## 2019-07-04 SURGERY — COLONOSCOPY WITH PROPOFOL
Anesthesia: General

## 2019-07-04 MED ORDER — PHENYLEPHRINE HCL (PRESSORS) 10 MG/ML IV SOLN
INTRAVENOUS | Status: DC | PRN
  Administered 2019-07-04 (×2): 100 ug via INTRAVENOUS

## 2019-07-04 MED ORDER — SODIUM CHLORIDE 0.9 % IV SOLN: INTRAVENOUS | Status: DC

## 2019-07-04 MED ORDER — PROPOFOL 500 MG/50ML IV EMUL
INTRAVENOUS | Status: DC | PRN
  Administered 2019-07-04: 120 ug/kg/min via INTRAVENOUS

## 2019-07-04 MED ORDER — PROPOFOL 10 MG/ML IV BOLUS
INTRAVENOUS | Status: DC | PRN
  Administered 2019-07-04: 20 mg via INTRAVENOUS
  Administered 2019-07-04: 50 mg via INTRAVENOUS

## 2019-07-04 MED ORDER — SODIUM CHLORIDE 0.9 % IV SOLN
INTRAVENOUS | Status: DC
  Administered 2019-07-04: 13:00:00 via INTRAVENOUS

## 2019-07-04 MED ORDER — LIDOCAINE HCL (CARDIAC) PF 100 MG/5ML IV SOSY
PREFILLED_SYRINGE | INTRAVENOUS | Status: DC | PRN
  Administered 2019-07-04: 50 mg via INTRAVENOUS

## 2019-07-04 MED ORDER — PROPOFOL 10 MG/ML IV BOLUS
INTRAVENOUS | Status: AC
  Filled 2019-07-04: qty 20

## 2019-07-04 MED ORDER — PROPOFOL 500 MG/50ML IV EMUL
INTRAVENOUS | Status: AC
  Filled 2019-07-04: qty 50

## 2019-07-04 NOTE — Anesthesia Preprocedure Evaluation (Signed)
Anesthesia Evaluation  Patient identified by MRN, date of birth, ID band Patient awake    Reviewed: Allergy & Precautions, H&P , NPO status , Patient's Chart, lab work & pertinent test results, reviewed documented beta blocker date and time   Airway Mallampati: II   Neck ROM: full    Dental  (+) Poor Dentition   Pulmonary neg pulmonary ROS, former smoker,    Pulmonary exam normal        Cardiovascular Exercise Tolerance: Good hypertension, On Medications negative cardio ROS Normal cardiovascular exam Rhythm:regular Rate:Normal     Neuro/Psych CVA, Residual Symptoms negative psych ROS   GI/Hepatic Neg liver ROS, GERD  ,  Endo/Other  negative endocrine ROS  Renal/GU negative Renal ROS  negative genitourinary   Musculoskeletal   Abdominal   Peds  Hematology negative hematology ROS (+)   Anesthesia Other Findings Past Medical History: 02/2013: CVA (cerebral vascular accident) ([place])     Comment:  Slight right lacunar CVA manifested by left upper and               lower exteremity paresthesias. MRI of brain showed small               vessel disease only. No obvious CVA. Ultrasound of               carotids showed insignificant disease. No date: GERD (gastroesophageal reflux disease) No date: Hyperlipidemia No date: Hypertension No date: Osteoarthritis     Comment:  Degenerative arthritis medial compartment of the left               knee Past Surgical History: No date: BELOW KNEE LEG AMPUTATION 01/19/2017: COLONOSCOPY WITH PROPOFOL; N/A     Comment:  Procedure: COLONOSCOPY WITH PROPOFOL;  Surgeon: Lollie Sails, MD;  Location: [facility] ENDOSCOPY;  Service:               Endoscopy;  Laterality: N/A; 04/15/2012: OSTEOTOMY; Left     Comment:  1st metatarsal. With cheilectomy left foot 1st MTPJ. Dr.              Elvina Mattes BMI    Body Mass Index: 29.13 kg/m     Reproductive/Obstetrics negative OB  ROS                             Anesthesia Physical Anesthesia Plan  ASA: III  Anesthesia Plan: General   Post-op Pain Management:    Induction:   PONV Risk Score and Plan:   Airway Management Planned:   Additional Equipment:   Intra-op Plan:   Post-operative Plan:   Informed Consent: I have reviewed the patients History and Physical, chart, labs and discussed the procedure including the risks, benefits and alternatives for the proposed anesthesia with the patient or authorized representative who has indicated his/her understanding and acceptance.     Dental Advisory Given  Plan Discussed with: CRNA  Anesthesia Plan Comments:         Anesthesia Quick Evaluation

## 2019-07-04 NOTE — Op Note (Signed)
[facility] Gastroenterology Patient Name: Juan Scott Procedure Date: 07/04/2019 1:42 PM MRN: [id number] Account #: [id number] Date of Birth: [birth year] Admit Type: Outpatient Age: 63 Room: [facility] ENDO ROOM 3 Gender: Male Note Status: Finalized Procedure:            Colonoscopy Indications:          Personal history of colonic polyps Providers:            Lollie Sails, MD Referring MD:         Caprice Renshaw MD (Referring MD) Medicines:            Monitored Anesthesia Care Complications:        No immediate complications. Procedure:            Pre-Anesthesia Assessment:                       - ASA Grade Assessment: II - A patient with mild                        systemic disease.                       After obtaining informed consent, the colonoscope was                        passed under direct vision. Throughout the procedure,                        the patient's blood pressure, pulse, and oxygen                        saturations were monitored continuously. The                        Colonoscope was introduced through the anus and                        advanced to the the cecum, identified by appendiceal                        orifice and ileocecal valve. The colonoscopy was                        performed without difficulty. The patient tolerated the                        procedure well. The quality of the bowel preparation                        was good. Findings:      A 5 mm polyp was found in the transverse colon. The polyp was sessile.       The polyp was removed with a cold snare. Resection and retrieval were       complete.      Multiple medium-mouthed diverticula were found in the sigmoid colon,       descending colon, transverse colon and ascending colon.      Non-bleeding internal hemorrhoids were found during retroflexion and       during anoscopy. The hemorrhoids were small and Grade I (internal       hemorrhoids that do not  prolapse).      The digital rectal exam was otherwise normal. Impression:           - One 5 mm polyp in the transverse colon, removed with                        a cold snare. Resected and retrieved.                       - Diverticulosis in the sigmoid colon, in the                        descending colon, in the transverse colon and in the                        ascending colon.                       - Non-bleeding internal hemorrhoids. Recommendation:       - Await pathology results.                       - Telephone GI clinic for pathology results in 5 days. Procedure Code(s):    --- Professional ---                       [phone], Colonoscopy, flexible; with removal of tumor(s),                        polyp(s), or other lesion(s) by snare technique Diagnosis Code(s):    --- Professional ---                       K63.5, Polyp of colon                       K64.0, First degree hemorrhoids                       Z86.010, Personal history of colonic polyps                       K57.30, Diverticulosis of large intestine without                        perforation or abscess without bleeding CPT copyright 2019 American [employer] Association. All rights reserved. The codes documented in this report are preliminary and upon coder review may  be revised to meet current compliance requirements. Lollie Sails, MD 07/04/2019 2:10:13 PM This report has been signed electronically. Number of Addenda: 0 Note Initiated On: 07/04/2019 1:42 PM Scope Withdrawal Time: 0 hours 11 minutes 31 seconds  Total Procedure Duration: 0 hours 19 minutes 12 seconds       [facility]

## 2019-07-04 NOTE — H&P (Signed)
Outpatient short stay form Pre-procedure 07/04/2019 1:39 PM Juan Sails MD  Primary Physician: Dr Derinda Late  Reason for visit: Colonoscopy  History of present illness: Patient is a 63 year old male presenting today for colonoscopy.  He tolerated his prep well.  He takes no aspirin or blood thinning agent.  His last colonoscopy was 01/19/2017 with a finding of a size tubulovillous adenoma the descending colon.  Also some diverticulosis in the left colon.  He denies any rectal bleeding abdominal pain or diarrhea.  He does however take 81 mg aspirin daily.    Current Facility-Administered Medications:  .  0.9 %  sodium chloride infusion, , Intravenous, Continuous, Juan Sails, MD .  0.9 %  sodium chloride infusion, , Intravenous, Continuous, Juan Sails, MD, Last Rate: 20 mL/hr at 07/04/19 1312  Medications Prior to Admission  Medication Sig Dispense Refill Last Dose  . atorvastatin (LIPITOR) 10 MG tablet Take 10 mg by mouth daily at 6 PM.   Past Week at Unknown time  . ibuprofen (ADVIL,MOTRIN) 800 MG tablet Take 800 mg by mouth every 8 (eight) hours as needed. For pain    Past Week at Unknown time  . aspirin EC 81 MG tablet Take 81 mg by mouth daily.   Not Taking at Unknown time  . esomeprazole (NEXIUM) 40 MG capsule Take 40 mg by mouth daily as needed. For acid reflux    Not Taking at Unknown time  . HYDROcodone-acetaminophen (NORCO) 5-325 MG per tablet One po q 4-6 hrs prn pain (Patient not taking: Reported on 07/04/2019) 20 tablet 0 Not Taking at Unknown time  . ibuprofen (ADVIL,MOTRIN) 600 MG tablet Take 600 mg by mouth every 6 (six) hours as needed. For pain    Not Taking at Unknown time  . meloxicam (MOBIC) 15 MG tablet Take 15 mg by mouth daily. As needed   Not Taking at Unknown time  . ranitidine (ZANTAC) 300 MG capsule Take 300 mg by mouth daily.   Not Taking at Unknown time     No Known Allergies   Past Medical History:  Diagnosis Date  . CVA (cerebral  vascular accident) ([place]) 02/2013   Slight right lacunar CVA manifested by left upper and lower exteremity paresthesias. MRI of brain showed small vessel disease only. No obvious CVA. Ultrasound of carotids showed insignificant disease.  Marland Kitchen GERD (gastroesophageal reflux disease)   . Hyperlipidemia   . Hypertension   . Osteoarthritis    Degenerative arthritis medial compartment of the left knee    Review of systems:      Physical Exam    Heart and lungs: Regular rate and rhythm without rub or gallop lungs are bilaterally clear    HEENT: Normocephalic atraumatic eyes are anicteric    Other:    Pertinant exam for procedure: Soft nontender nondistended bowel sounds positive normoactive    Planned proceedures: Colonoscopy and indicated procedures. I have discussed the risks benefits and complications of procedures to include not limited to bleeding, infection, perforation and the risk of sedation and the patient wishes to proceed.    Juan Sails, MD Gastroenterology 07/04/2019  1:39 PM

## 2019-07-04 NOTE — Anesthesia Postprocedure Evaluation (Signed)
Anesthesia Post Note  Patient: Juan Scott  Procedure(s) Performed: COLONOSCOPY WITH PROPOFOL (N/A )  Patient location during evaluation: Endoscopy Anesthesia Type: General Level of consciousness: awake and alert and oriented Pain management: pain level controlled Vital Signs Assessment: post-procedure vital signs reviewed and stable Respiratory status: spontaneous breathing, nonlabored ventilation and respiratory function stable Cardiovascular status: blood pressure returned to baseline and stable Postop Assessment: no signs of nausea or vomiting Anesthetic complications: no     Last Vitals:  Vitals:   07/04/19 1440 07/04/19 1441  BP: (!) 143/84 (!) 143/84  Pulse: 61 (!) 59  Resp: 14 17  Temp:    SpO2: 100% 100%    Last Pain:  Vitals:   07/04/19 1430  TempSrc:   PainSc: (P) 0-No pain                 Rishaan Gunner

## 2019-07-04 NOTE — Transfer of Care (Signed)
Immediate Anesthesia Transfer of Care Note  Patient: VED MACKELLAR  Procedure(s) Performed: COLONOSCOPY WITH PROPOFOL (N/A )  Patient Location: PACU  Anesthesia Type:General  Level of Consciousness: awake, alert  and oriented  Airway & Oxygen Therapy: Patient Spontanous Breathing and Patient connected to nasal cannula oxygen  Post-op Assessment: Report given to RN and Post -op Vital signs reviewed and stable  Post vital signs: Reviewed and stable  Last Vitals:  Vitals Value Taken Time  BP    Temp    Pulse 61 07/04/19 1410  Resp 15 07/04/19 1410  SpO2 98 % 07/04/19 1410  Vitals shown include unvalidated device data.  Last Pain:  Vitals:   07/04/19 1244  TempSrc: Tympanic      Patients Stated Pain Goal: 0 ([id number])  Complications: No apparent anesthesia complications

## 2019-07-04 NOTE — Anesthesia Post-op Follow-up Note (Signed)
Anesthesia QCDR form completed.        

## 2019-07-05 ENCOUNTER — Encounter: Payer: Self-pay | Admitting: Gastroenterology

## 2019-07-06 LAB — SURGICAL PATHOLOGY

## 2022-01-20 ENCOUNTER — Ambulatory Visit (LOCAL_COMMUNITY_HEALTH_CENTER): Payer: Medicare Other

## 2022-01-20 ENCOUNTER — Other Ambulatory Visit: Payer: Self-pay

## 2022-01-20 DIAGNOSIS — Z23 Encounter for immunization: Secondary | ICD-10-CM | POA: Diagnosis not present

## 2022-01-20 DIAGNOSIS — Z719 Counseling, unspecified: Secondary | ICD-10-CM

## 2022-01-20 NOTE — Progress Notes (Signed)
?  Are you feeling sick today? No ? ? ?Have you ever received a dose of COVID-19 Vaccine? [employer], [place], [place], [place], Other) Yes ? ?If yes, which vaccine?  ?How many dose of COVID-19 vaccine were administered?   Pfizer primary series 2 doses and Pfizer monovalent booster 1 dose ? ? ?Did you bring the vaccination record card or other documentation?  Yes ? ? ?Does the person to be vaccinated have a health condition or is undergoing treatment that makes them moderately or severely immunocompromised? This would include, but not be limited to, treatment for cancer, HIV, receipt of organ transplant, immunosuppressive therapy or high-dose corticosteroids, CAR-T-cell therapy, hematopoietic cell transplant [HCT], or moderate or severe primary immunodeficiency.  No ? ?Has the person to be vaccinated received COVID-19 vaccine before or during hematopoietic cell transplant (HCT) or CAR-T-cell therapies? No ? ?Has the person to be vaccinated ever had an allergic reaction to: (This would include a severe allergic reaction [e.g., anaphylaxis] that required treatment with epinephrine or EpiPen? or that caused you to go to the hospital. It would also include an allergic reaction that caused hives, swelling, or respiratory distress, including wheezing.) A component of a COVID-19 vaccine or a previous dose of COVID-19 vaccine? No ? ? ?Has the person to be vaccinated ever had an allergic reaction to another vaccine (other thanCOVID-19 vaccine) or an injectable medication? (This would include a severe allergic reaction [e.g., anaphylaxis] that required treatment with epinephrine or EpiPen? or that caused youto go to the hospital. It would also include an allergic reaction that caused hives, swelling, or respiratory distress, including wheezing.)   No ?  ?Do you have a history of any of the following: ? ?Myocarditis or Pericarditis No ?Have a history of thrombosis with thrombocytopenia syndrome (TTS) No ?Multisystem Inflammatory  Syndrome (MIS-C or MIS-A)? No ?Immune-mediate syndrome defined by thrombosis and thrombocytopenia, such as heparin--induced thrombocytopenia (HIT)  No ?Have a history of Guillain-Barr? Syndrome (GBS) No ?Have a history of COVID-19 disease within the past 3 months? No ?Vaccinated with monkeypox vaccine in the last 4 weeks? No ? ?[facility] updated and copy given to patient. Ranae Palms, RN ?

## 2023-01-15 ENCOUNTER — Other Ambulatory Visit: Payer: Self-pay | Admitting: Otolaryngology

## 2023-01-15 DIAGNOSIS — H918X3 Other specified hearing loss, bilateral: Secondary | ICD-10-CM

## 2023-02-03 ENCOUNTER — Ambulatory Visit
Admission: RE | Admit: 2023-02-03 | Discharge: 2023-02-03 | Disposition: A | Discharge status: Home / Self Care | Payer: Medicare Other | Source: Ambulatory Visit | Attending: Otolaryngology | Admitting: Otolaryngology

## 2023-02-03 DIAGNOSIS — H918X3 Other specified hearing loss, bilateral: Secondary | ICD-10-CM

## 2023-02-03 MED ORDER — GADOPICLENOL 0.5 MMOL/ML IV SOLN
10.0000 mL | Freq: Once | INTRAVENOUS | Status: AC | PRN
  Administered 2023-02-03: 10 mL via INTRAVENOUS

## 2024-10-11 NOTE — Discharge Instructions (Signed)

## 2024-10-13 ENCOUNTER — Encounter: Payer: Self-pay | Admitting: Ophthalmology

## 2024-10-13 NOTE — Anesthesia Preprocedure Evaluation (Addendum)
 Anesthesia Evaluation  Patient identified by MRN, date of birth, ID band Patient awake    Reviewed: Allergy & Precautions, H&P , NPO status , Patient's Chart, lab work & pertinent test results, reviewed documented beta blocker date and time   History of Anesthesia Complications Negative for: history of anesthetic complications  Airway Mallampati: I  TM Distance: >3 FB Neck ROM: full    Dental  (+) Dental Advidsory Given, Teeth Intact   Pulmonary neg shortness of breath, neg COPD, neg recent URI, Current Smoker   Pulmonary exam normal breath sounds clear to auscultation       Cardiovascular Exercise Tolerance: Good negative cardio ROS Normal cardiovascular exam Rhythm:regular Rate:Normal     Neuro/Psych neg Seizures CVA (mild, no deficits)  negative psych ROS   GI/Hepatic Neg liver ROS,GERD  Controlled,,  Endo/Other  negative endocrine ROS    Renal/GU negative Renal ROS  negative genitourinary   Musculoskeletal   Abdominal   Peds  Hematology negative hematology ROS (+)   Anesthesia Other Findings Past Medical History: No date: Chronic pain of left thumb 02/2013: CVA (cerebral vascular accident) ([facility])     Comment:  Slight right lacunar CVA manifested by left upper and               lower exteremity paresthesias. MRI of brain showed small               vessel disease only. No obvious CVA. Ultrasound of               carotids showed insignificant disease. No date: GERD (gastroesophageal reflux disease)     Comment:  only with spicy foods No date: History of right below knee amputation ([facility]) No date: Hyperlipidemia No date: Osteoarthritis     Comment:  Degenerative arthritis medial compartment of the left               knee No date: Pre-diabetes No date: Thrombocytopenia No date: Tinnitus of both ears   Reproductive/Obstetrics negative OB ROS                              Anesthesia  Physical Anesthesia Plan  ASA: 2  Anesthesia Plan: MAC   Post-op Pain Management:    Induction: Intravenous  PONV Risk Score and Plan: 0 and Midazolam  and Treatment may vary due to age or medical condition  Airway Management Planned: Natural Airway and Nasal Cannula  Additional Equipment:   Intra-op Plan:   Post-operative Plan:   Informed Consent: I have reviewed the patients History and Physical, chart, labs and discussed the procedure including the risks, benefits and alternatives for the proposed anesthesia with the patient or authorized representative who has indicated his/her understanding and acceptance.     Dental Advisory Given  Plan Discussed with: Anesthesiologist, CRNA and Surgeon  Anesthesia Plan Comments:         Anesthesia Quick Evaluation

## 2024-10-14 ENCOUNTER — Encounter: Payer: Self-pay | Admitting: Ophthalmology

## 2024-10-17 ENCOUNTER — Encounter: Payer: Self-pay | Admitting: Ophthalmology

## 2024-10-17 ENCOUNTER — Ambulatory Visit: Payer: Self-pay | Admitting: Anesthesiology

## 2024-10-17 ENCOUNTER — Other Ambulatory Visit: Payer: Self-pay

## 2024-10-17 ENCOUNTER — Encounter: Admission: RE | Disposition: A | Payer: Self-pay | Source: Home / Self Care | Attending: Ophthalmology

## 2024-10-17 ENCOUNTER — Ambulatory Visit
Admission: RE | Admit: 2024-10-17 | Discharge: 2024-10-17 | Disposition: A | Attending: Ophthalmology | Admitting: Ophthalmology

## 2024-10-17 DIAGNOSIS — H2512 Age-related nuclear cataract, left eye: Secondary | ICD-10-CM | POA: Diagnosis present

## 2024-10-17 HISTORY — PX: CATARACT EXTRACTION W/PHACO: SHX586

## 2024-10-17 HISTORY — DX: Tinnitus, bilateral: H93.13

## 2024-10-17 HISTORY — DX: Thrombocytopenia, unspecified: D69.6

## 2024-10-17 HISTORY — DX: Prediabetes: R73.03

## 2024-10-17 HISTORY — DX: Other chronic pain: G89.29

## 2024-10-17 HISTORY — DX: Acquired absence of right leg below knee: Z89.511

## 2024-10-17 MED ORDER — PHENYLEPHRINE HCL 10 % OP SOLN
1.0000 [drp] | OPHTHALMIC | Status: AC
  Administered 2024-10-17 (×3): 1 [drp] via OPHTHALMIC

## 2024-10-17 MED ORDER — LACTATED RINGERS IV SOLN: INTRAVENOUS | Status: DC

## 2024-10-17 MED ORDER — CYCLOPENTOLATE HCL 2 % OP SOLN
1.0000 [drp] | OPHTHALMIC | Status: AC
  Administered 2024-10-17 (×3): 1 [drp] via OPHTHALMIC

## 2024-10-17 MED ORDER — CYCLOPENTOLATE HCL 2 % OP SOLN
OPHTHALMIC | Status: AC
  Filled 2024-10-17: qty 2

## 2024-10-17 MED ORDER — SIGHTPATH DOSE#1 BSS IO SOLN
INTRAOCULAR | Status: DC | PRN
  Administered 2024-10-17: 15 mL via INTRAOCULAR

## 2024-10-17 MED ORDER — MIDAZOLAM HCL (PF) 2 MG/2ML IJ SOLN
INTRAMUSCULAR | Status: DC | PRN
  Administered 2024-10-17: 2 mg via INTRAVENOUS

## 2024-10-17 MED ORDER — FENTANYL CITRATE (PF) 100 MCG/2ML IJ SOLN
INTRAMUSCULAR | Status: AC
  Filled 2024-10-17: qty 2

## 2024-10-17 MED ORDER — MIDAZOLAM HCL 2 MG/2ML IJ SOLN
INTRAMUSCULAR | Status: AC
  Filled 2024-10-17: qty 2

## 2024-10-17 MED ORDER — TETRACAINE HCL 0.5 % OP SOLN
OPHTHALMIC | Status: AC
  Filled 2024-10-17: qty 4

## 2024-10-17 MED ORDER — TETRACAINE HCL 0.5 % OP SOLN
1.0000 [drp] | OPHTHALMIC | Status: DC | PRN
  Administered 2024-10-17 (×3): 1 [drp] via OPHTHALMIC

## 2024-10-17 MED ORDER — LIDOCAINE HCL (PF) 2 % IJ SOLN
INTRAOCULAR | Status: DC | PRN
  Administered 2024-10-17: 4 mL via INTRAOCULAR

## 2024-10-17 MED ORDER — SIGHTPATH DOSE#1 BSS IO SOLN
INTRAOCULAR | Status: DC | PRN
  Administered 2024-10-17: 89 mL via OPHTHALMIC

## 2024-10-17 MED ORDER — PHENYLEPHRINE HCL 10 % OP SOLN
OPHTHALMIC | Status: AC
  Filled 2024-10-17: qty 5

## 2024-10-17 MED ORDER — FENTANYL CITRATE (PF) 100 MCG/2ML IJ SOLN
INTRAMUSCULAR | Status: DC | PRN
  Administered 2024-10-17 (×2): 50 ug via INTRAVENOUS

## 2024-10-17 MED ORDER — SIGHTPATH DOSE#1 NA HYALUR & NA CHOND-NA HYALUR IO KIT
PACK | INTRAOCULAR | Status: DC | PRN
  Administered 2024-10-17: 1 via OPHTHALMIC

## 2024-10-17 MED ORDER — MOXIFLOXACIN HCL 0.5 % OP SOLN
OPHTHALMIC | Status: DC | PRN
  Administered 2024-10-17: .2 mL via OPHTHALMIC

## 2024-10-17 SURGICAL SUPPLY — 1 items: NDL FILTER BLUNT 18X1 1/2 (NEEDLE) ×1 IMPLANT

## 2024-10-17 NOTE — H&P (Signed)
 [facility]   Primary Care Physician:  Diedra Lame, MD Ophthalmologist: Dr. Adine Novak  Pre-Procedure History & Physical: HPI:  Juan Scott is a 68 y.o. male here for cataract surgery.   Past Medical History:  Diagnosis Date   Chronic pain of left thumb    CVA (cerebral vascular accident) ([facility]) 02/2013   Slight right lacunar CVA manifested by left upper and lower exteremity paresthesias. MRI of brain showed small vessel disease only. No obvious CVA. Ultrasound of carotids showed insignificant disease.   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   Pre-diabetes    Thrombocytopenia    Tinnitus of both ears     Past Surgical History:  Procedure Laterality Date   BELOW KNEE LEG AMPUTATION     COLONOSCOPY WITH PROPOFOL  N/A 01/19/2017   Procedure: COLONOSCOPY WITH PROPOFOL ;  Surgeon: Gladis RAYMOND Mariner, MD;  Location: [facility] ENDOSCOPY;  Service: Endoscopy;  Laterality: N/A;   COLONOSCOPY WITH PROPOFOL  N/A 07/04/2019   Procedure: COLONOSCOPY WITH PROPOFOL ;  Surgeon: Mariner Gladis RAYMOND, MD;  Location: [facility] ENDOSCOPY;  Service: Endoscopy;  Laterality: N/A;   OSTEOTOMY Left 04/15/2012   1st metatarsal. With cheilectomy left foot 1st MTPJ. Dr. Lilli    Prior to Admission medications   Medication Sig Start Date End Date Taking? Authorizing Provider  atorvastatin (LIPITOR) 10 MG tablet Take 10 mg by mouth daily at 6 PM.   Yes [provider]  fluticasone (FLONASE) 50 MCG/ACT nasal spray Place 2 sprays into both nostrils daily as needed for allergies or rhinitis.   Yes [provider]  ibuprofen  (ADVIL ,MOTRIN ) 800 MG tablet Take 800 mg by mouth every 8 (eight) hours as needed. For pain    Yes [provider]    Allergies as of 09/26/2024   (No Known Allergies)    History reviewed. No  pertinent family history.  Social History   Socioeconomic History   Marital status: Married    Spouse name: Not on file   Number of children: Not on file   Years of education: Not on file   Highest education level: Not on file  Occupational History   Not on file  Tobacco Use   Smoking status: Some Days    Types: Cigars   Smokeless tobacco: Never  Vaping Use   Vaping status: Never Used  Substance and Sexual Activity   Alcohol use: Yes    Alcohol/week: 2.0 standard drinks of alcohol    Types: 2 Cans of beer per week    Comment: 4 on the weekend   Drug use: No   Sexual activity: Not on file  Other Topics Concern   Not on file  Social History Narrative   Not on file   Social Drivers of Health   Financial Resource Strain: Low Risk  (10/14/2024)   Received from [facility]   Overall Financial Resource Strain (CARDIA)    Difficulty of Paying Living Expenses: Not hard at all  Food Insecurity: No Food Insecurity (10/14/2024)   Received from [facility]   Hunger Vital Sign    Within the past 12 months, you worried that your food would run out before you got the money to buy more.: Never true    Within the past 12 months, the food you bought just  didn't last and you didn't have money to get more.: Never true  Transportation Needs: No Transportation Needs (10/14/2024)   Received from [facility] - Transportation    In the past 12 months, has lack of transportation kept you from medical appointments or from getting medications?: No    Lack of Transportation (Non-Medical): No  Physical Activity: Not on file  Stress: Not on file  Social Connections: Not on file  Intimate Partner Violence: Not on file    Review of Systems: See HPI, otherwise negative ROS  Physical Exam: BP (!) 160/87   Pulse 65   Temp (!) 96.7 F (35.9 C) (Temporal)   Resp 16   Ht 5' 10 (1.778 m)   Wt 96.6 kg   SpO2 98%   BMI 30.56 kg/m   General:   Alert, cooperative. Head:  Normocephalic and atraumatic. Respiratory:  Normal work of breathing. Cardiovascular:  NAD  Impression/Plan: Juan Scott is here for cataract surgery.  Risks, benefits, limitations, and alternatives regarding cataract surgery have been reviewed with the patient.  Questions have been answered.  All parties agreeable.   Adine Novak, MD  10/17/2024, 11:44 AM

## 2024-10-17 NOTE — Op Note (Signed)
 OPERATIVE NOTE  Juan Scott [id number] 10/17/2024   PREOPERATIVE DIAGNOSIS:  Nuclear sclerotic cataract left eye.  H25.12   POSTOPERATIVE DIAGNOSIS:    Nuclear sclerotic cataract left eye.     PROCEDURE:  Phacoemusification with posterior chamber intraocular lens placement of the left eye   LENS:   Implant Name Type Inv. Item Serial No. Manufacturer Lot No. LRB No. Used Action  [employer] PanOptix Pro IOL 19.5 Intraocular Lens  [id number]   Left 1 Implanted and Explanted  IMPLANT RECORD       1 Implanted      Procedure(s): PHACOEMULSIFICATION, CATARACT, WITH IOL INSERTION 3.40 00:28.5 (Left)  SURGEON:  Adine Novak, MD, MPH   ANESTHESIA:  Topical with tetracaine  drops augmented with 1% preservative-free intracameral lidocaine .  ESTIMATED BLOOD LOSS: <1 mL   COMPLICATIONS:  None.   DESCRIPTION OF PROCEDURE:  The patient was identified in the holding room and transported to the operating room and placed in the supine position under the operating microscope.  The left eye was identified as the operative eye and it was prepped and draped in the usual sterile ophthalmic fashion.   A 1.0 millimeter clear-corneal paracentesis was made at the 5:00 position. 0.5 ml of preservative-free 1% lidocaine  with epinephrine  was injected into the anterior chamber.  The anterior chamber was filled with viscoelastic.  A 2.4 millimeter keratome was used to make a near-clear corneal incision at the 2:00 position.  A curvilinear capsulorrhexis was made with a cystotome and capsulorrhexis forceps.  Balanced salt solution was used to hydrodissect and hydrodelineate the nucleus.   Phacoemulsification was then used in stop and chop fashion to remove the lens nucleus and epinucleus.  The remaining cortex was then removed using the irrigation and aspiration handpiece. Viscoelastic was then placed into the capsular bag to distend it for lens placement.  A lens was then injected into the capsular bag.    A scratch  was noted near the center of the optic of the lens.  MST micrograspers and microscissors were used to bisect the lens and remove it without difficulty.    The backup replacement lens was loaded and injected without difficulty.  The remaining viscoelastic was aspirated.   Wounds were hydrated with balanced salt solution.  The anterior chamber was inflated to a physiologic pressure with balanced salt solution. The lens was well centered.    Intracameral vigamox  0.1 mL undiltued was injected into the eye and a drop placed onto the ocular surface.  No wound leaks were noted.  The patient was taken to the recovery room in stable condition without complications of anesthesia or surgery  Adine Novak 10/17/2024, 12:21 PM

## 2024-10-17 NOTE — Anesthesia Postprocedure Evaluation (Signed)
 Anesthesia Post Note  Patient: Juan Scott  Procedure(s) Performed: PHACOEMULSIFICATION, CATARACT, WITH IOL INSERTION 3.40 00:28.5 (Left: Eye)  Patient location during evaluation: PACU Anesthesia Type: MAC Level of consciousness: awake and alert Pain management: pain level controlled Vital Signs Assessment: post-procedure vital signs reviewed and stable Respiratory status: spontaneous breathing, nonlabored ventilation, respiratory function stable and patient connected to nasal cannula oxygen Cardiovascular status: stable and blood pressure returned to baseline Postop Assessment: no apparent nausea or vomiting Anesthetic complications: no   No notable events documented.   Last Vitals:  Vitals:   10/17/24 1130 10/17/24 1224  BP: (!) 160/87 (!) 146/75  Pulse:  (!) 53  Resp:  20  Temp:  36.7 C  SpO2:  100%    Last Pain:  Vitals:   10/17/24 1036  TempSrc: Temporal  PainSc: 0-No pain                 Prentice Murphy

## 2024-10-17 NOTE — Transfer of Care (Signed)
 Immediate Anesthesia Transfer of Care Note  Patient: AVREY HYSER  Procedure(s) Performed: PHACOEMULSIFICATION, CATARACT, WITH IOL INSERTION 3.40 00:28.5 (Left: Eye)  Patient Location: PACU  Anesthesia Type: MAC  Level of Consciousness: awake, alert  and patient cooperative  Airway and Oxygen Therapy: Patient Spontanous Breathing   Post-op Assessment: Post-op Vital signs reviewed, Patient's Cardiovascular Status Stable, Respiratory Function Stable, Patent Airway and No signs of Nausea or vomiting  Post-op Vital Signs: Reviewed and stable  Complications: No notable events documented.

## 2024-10-19 ENCOUNTER — Encounter: Payer: Self-pay | Admitting: Ophthalmology

## 2024-10-20 ENCOUNTER — Encounter: Payer: Self-pay | Admitting: Ophthalmology

## 2024-10-26 NOTE — Anesthesia Preprocedure Evaluation (Addendum)
 Anesthesia Evaluation    Airway Mallampati: I  TM Distance: >3 FB     Dental no notable dental hx.    Pulmonary Current Smoker          Cardiovascular      Neuro/Psych    GI/Hepatic   Endo/Other    Renal/GU      Musculoskeletal   Abdominal   Peds  Hematology   Anesthesia Other Findings  Previous cataract surgery 10-17-24 Dr. Dario  CVA (cerebral vascular accident) 02-2013   GERD (gastroesophageal reflux disease) Hyperlipidemia  Osteoarthritis History of right below knee amputation Thrombocytopenia Pre-diabetes  Tinnitus of both ears Chronic pain of left thumb     Reproductive/Obstetrics                              Anesthesia Physical Anesthesia Plan  ASA: 2  Anesthesia Plan: MAC   Post-op Pain Management:    Induction: Intravenous  PONV Risk Score and Plan:   Airway Management Planned: Natural Airway and Nasal Cannula  Additional Equipment:   Intra-op Plan:   Post-operative Plan:   Informed Consent: I have reviewed the patients History and Physical, chart, labs and discussed the procedure including the risks, benefits and alternatives for the proposed anesthesia with the patient or authorized representative who has indicated his/her understanding and acceptance.     Dental Advisory Given  Plan Discussed with: Anesthesiologist, CRNA and Surgeon  Anesthesia Plan Comments: (Patient consented for risks of anesthesia including but not limited to:  - adverse reactions to medications - damage to eyes, teeth, lips or other oral mucosa - nerve damage due to positioning  - sore throat or hoarseness - Damage to heart, brain, nerves, lungs, other parts of body or loss of life  Patient voiced understanding and assent.)         Anesthesia Quick Evaluation

## 2024-10-27 NOTE — Discharge Instructions (Signed)

## 2024-10-31 ENCOUNTER — Other Ambulatory Visit: Payer: Self-pay

## 2024-10-31 ENCOUNTER — Ambulatory Visit: Payer: Self-pay | Admitting: Anesthesiology

## 2024-10-31 ENCOUNTER — Encounter: Admission: RE | Disposition: A | Payer: Self-pay | Attending: Ophthalmology

## 2024-10-31 ENCOUNTER — Ambulatory Visit
Admission: RE | Admit: 2024-10-31 | Discharge: 2024-10-31 | Disposition: A | Discharge status: Home / Self Care | Attending: Ophthalmology | Admitting: Ophthalmology

## 2024-10-31 DIAGNOSIS — K219 Gastro-esophageal reflux disease without esophagitis: Secondary | ICD-10-CM | POA: Diagnosis not present

## 2024-10-31 DIAGNOSIS — H2511 Age-related nuclear cataract, right eye: Secondary | ICD-10-CM | POA: Diagnosis present

## 2024-10-31 DIAGNOSIS — F17210 Nicotine dependence, cigarettes, uncomplicated: Secondary | ICD-10-CM | POA: Diagnosis not present

## 2024-10-31 HISTORY — PX: CATARACT EXTRACTION W/PHACO: SHX586

## 2024-10-31 SURGERY — PHACOEMULSIFICATION, CATARACT, WITH IOL INSERTION
Anesthesia: Monitor Anesthesia Care | Site: Eye | Laterality: Right

## 2024-10-31 MED ORDER — FENTANYL CITRATE (PF) 100 MCG/2ML IJ SOLN
INTRAMUSCULAR | Status: DC | PRN
  Administered 2024-10-31: 50 ug via INTRAVENOUS

## 2024-10-31 MED ORDER — SIGHTPATH DOSE#1 BSS IO SOLN
INTRAOCULAR | Status: DC | PRN
  Administered 2024-10-31: 15 mL via INTRAOCULAR

## 2024-10-31 MED ORDER — SIGHTPATH DOSE#1 BSS IO SOLN
INTRAOCULAR | Status: DC | PRN
  Administered 2024-10-31: 89 mL via OPHTHALMIC

## 2024-10-31 MED ORDER — PHENYLEPHRINE HCL 10 % OP SOLN
OPHTHALMIC | Status: AC
  Filled 2024-10-31: qty 5

## 2024-10-31 MED ORDER — MIDAZOLAM HCL (PF) 2 MG/2ML IJ SOLN
INTRAMUSCULAR | Status: DC | PRN
  Administered 2024-10-31: 2 mg via INTRAVENOUS

## 2024-10-31 MED ORDER — MIDAZOLAM HCL 2 MG/2ML IJ SOLN
INTRAMUSCULAR | Status: AC
  Filled 2024-10-31: qty 2

## 2024-10-31 MED ORDER — CYCLOPENTOLATE HCL 2 % OP SOLN
1.0000 [drp] | OPHTHALMIC | Status: AC | PRN
  Administered 2024-10-31 (×3): 1 [drp] via OPHTHALMIC

## 2024-10-31 MED ORDER — PHENYLEPHRINE HCL 10 % OP SOLN
1.0000 [drp] | OPHTHALMIC | Status: DC | PRN
  Administered 2024-10-31 (×2): 1 [drp] via OPHTHALMIC

## 2024-10-31 MED ORDER — CYCLOPENTOLATE HCL 2 % OP SOLN
OPHTHALMIC | Status: AC
  Filled 2024-10-31: qty 2

## 2024-10-31 MED ORDER — FENTANYL CITRATE (PF) 100 MCG/2ML IJ SOLN
INTRAMUSCULAR | Status: AC
  Filled 2024-10-31: qty 2

## 2024-10-31 MED ORDER — LACTATED RINGERS IV SOLN: INTRAVENOUS | Status: DC

## 2024-10-31 MED ORDER — TETRACAINE HCL 0.5 % OP SOLN
1.0000 [drp] | OPHTHALMIC | Status: DC | PRN
  Administered 2024-10-31 (×3): 1 [drp] via OPHTHALMIC

## 2024-10-31 MED ORDER — LIDOCAINE HCL (PF) 2 % IJ SOLN
INTRAOCULAR | Status: DC | PRN
  Administered 2024-10-31: 4 mL via INTRAOCULAR

## 2024-10-31 MED ORDER — MOXIFLOXACIN HCL 0.5 % OP SOLN
OPHTHALMIC | Status: DC | PRN
  Administered 2024-10-31: .2 mL via OPHTHALMIC

## 2024-10-31 MED ORDER — SIGHTPATH DOSE#1 NA HYALUR & NA CHOND-NA HYALUR IO KIT
PACK | INTRAOCULAR | Status: DC | PRN
  Administered 2024-10-31: 1 via OPHTHALMIC

## 2024-10-31 MED ORDER — TETRACAINE HCL 0.5 % OP SOLN
OPHTHALMIC | Status: AC
  Filled 2024-10-31: qty 4

## 2024-10-31 SURGICAL SUPPLY — 10 items
Clareon PanOptix Pro 18.5 (Intraocular Lens) IMPLANT
DISSECTOR HYDRO NUCLEUS 50X22 (MISCELLANEOUS) ×1 IMPLANT
FEE CATARACT SUITE SIGHTPATH (MISCELLANEOUS) ×1 IMPLANT
GLOVE PI ULTRA LF STRL 7.5 (GLOVE) ×1 IMPLANT
GLOVE SURG SYN 6.5 PF PI BL (GLOVE) ×1 IMPLANT
GLOVE SURG SYN 8.5 PF PI BL (GLOVE) ×1 IMPLANT
LENS IOL PANO PRO 18 5 IMPLANT
NDL FILTER BLUNT 18X1 1/2 (NEEDLE) ×1 IMPLANT
NEEDLE FILTER BLUNT 18X1 1/2 (NEEDLE) ×1 IMPLANT
SYR 3ML LL SCALE MARK (SYRINGE) ×1 IMPLANT

## 2024-10-31 NOTE — Transfer of Care (Signed)
 Immediate Anesthesia Transfer of Care Note  Patient: Juan Scott  Procedure(s) Performed: PHACOEMULSIFICATION, CATARACT, WITH IOL INSERTION 4.03 00:32.1 (Right: Eye)  Patient Location: PACU  Anesthesia Type: MAC  Level of Consciousness: awake, alert  and patient cooperative  Airway and Oxygen Therapy: Patient Spontanous Breathing and Patient connected to supplemental oxygen  Post-op Assessment: Post-op Vital signs reviewed, Patient's Cardiovascular Status Stable, Respiratory Function Stable, Patent Airway and No signs of Nausea or vomiting  Post-op Vital Signs: Reviewed and stable  Complications: No notable events documented.

## 2024-10-31 NOTE — Anesthesia Postprocedure Evaluation (Signed)
"   Anesthesia Post Note  Patient: Juan Scott  Procedure(s) Performed: PHACOEMULSIFICATION, CATARACT, WITH IOL INSERTION 4.03 00:32.1 (Right: Eye)  Patient location during evaluation: PACU Anesthesia Type: MAC Level of consciousness: awake and alert Pain management: pain level controlled Vital Signs Assessment: post-procedure vital signs reviewed and stable Respiratory status: spontaneous breathing, nonlabored ventilation, respiratory function stable and patient connected to nasal cannula oxygen Cardiovascular status: stable and blood pressure returned to baseline Postop Assessment: no apparent nausea or vomiting Anesthetic complications: no   No notable events documented.   Last Vitals:  Vitals:   10/31/24 1145 10/31/24 1151  BP:  (!) 140/88  Pulse:  67  Resp:  15  Temp: 36.7 C 36.7 C  SpO2:  99%    Last Pain:  Vitals:   10/31/24 1151  TempSrc:   PainSc: 0-No pain                 Anika Shore C Laaibah Wartman      "

## 2024-10-31 NOTE — H&P (Signed)
 [facility]   Primary Care Physician:  Diedra Lame, MD Ophthalmologist: Dr. Adine Novak  Pre-Procedure History & Physical: HPI:  Juan Scott is a 68 y.o. male here for cataract surgery.   Past Medical History:  Diagnosis Date   Chronic pain of left thumb    CVA (cerebral vascular accident) (HCC) 02/2013   Slight right lacunar CVA manifested by left upper and lower exteremity paresthesias. MRI of brain showed small vessel disease only. No obvious CVA. Ultrasound of carotids showed insignificant disease.   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   Pre-diabetes    Thrombocytopenia    Tinnitus of both ears     Past Surgical History:  Procedure Laterality Date   BELOW KNEE LEG AMPUTATION     CATARACT EXTRACTION W/PHACO Left 10/17/2024   Procedure: PHACOEMULSIFICATION, CATARACT, WITH IOL INSERTION 3.40 00:28.5;  Surgeon: Novak Adine Anes, MD;  Location: [facility];  Service: Ophthalmology;  Laterality: Left;   COLONOSCOPY WITH PROPOFOL  N/A 01/19/2017   Procedure: COLONOSCOPY WITH PROPOFOL ;  Surgeon: Gladis RAYMOND Mariner, MD;  Location: [facility];  Service: Endoscopy;  Laterality: N/A;   COLONOSCOPY WITH PROPOFOL  N/A 07/04/2019   Procedure: COLONOSCOPY WITH PROPOFOL ;  Surgeon: Mariner Gladis RAYMOND, MD;  Location: [facility];  Service: Endoscopy;  Laterality: N/A;   OSTEOTOMY Left 04/15/2012   1st metatarsal. With cheilectomy left foot 1st MTPJ. Dr. Lilli    Prior to Admission medications  Medication Sig Start Date End Date Taking? Authorizing Provider  atorvastatin (LIPITOR) 10 MG tablet Take 10 mg by mouth daily at 6 PM.    [provider]  fluticasone (FLONASE) 50 MCG/ACT nasal spray Place 2 sprays into both nostrils daily as needed for allergies or rhinitis.    [provider]  ibuprofen  (ADVIL ,MOTRIN ) 800 MG tablet Take 800 mg by mouth every 8 (eight) hours as needed. For pain     [provider]    Allergies as of 09/26/2024   (No Known Allergies)    History reviewed. No pertinent family history.  Social History   Socioeconomic History   Marital status: Married    Spouse name: Not on file   Number of children: Not on file   Years of education: Not on file   Highest education level: Not on file  Occupational History   Not on file  Tobacco Use   Smoking status: Some Days    Types: Cigars   Smokeless tobacco: Never  Vaping Use   Vaping status: Never Used  Substance and Sexual Activity   Alcohol use: Yes    Alcohol/week: 2.0 standard drinks of alcohol    Types: 2 Cans of beer per week    Comment: 4 on the weekend   Drug use: No   Sexual activity: Not on file  Other Topics Concern   Not on file  Social History Narrative   Not on file   Social Drivers of Health   Tobacco Use: High Risk (10/19/2024)   Patient History    Smoking Tobacco Use: Some Days    Smokeless Tobacco Use: Never    Passive Exposure: Not on file  Financial Resource Strain: Low Risk  (10/14/2024)   Received from [facility]   Overall Financial Resource Strain (CARDIA)    Difficulty of  Paying Living Expenses: Not hard at all  Food Insecurity: No Food Insecurity (10/14/2024)   Received from [facility]   Epic    Within the past 12 months, you worried that your food would run out before you got the money to buy more.: Never true    Within the past 12 months, the food you bought just didn't last and you didn't have money to get more.: Never true  Transportation Needs: No Transportation Needs (10/14/2024)   Received from [facility] - Transportation    In the past 12 months, has lack of transportation kept you from medical appointments or from getting medications?: No    Lack of  Transportation (Non-Medical): No  Physical Activity: Not on file  Stress: Not on file  Social Connections: Not on file  Intimate Partner Violence: Not on file  Depression (EYV7-0): Not on file  Alcohol Screen: Not on file  Housing: Low Risk  (10/14/2024)   Received from [place]   Epic    In the last 12 months, was there a time when you were not able to pay the mortgage or rent on time?: No    In the past 12 months, how many times have you moved where you were living?: 1    At any time in the past 12 months, were you homeless or living in a shelter (including now)?: No  Utilities: Not At Risk (10/14/2024)   Received from [facility]   Epic    In the past 12 months has the electric, gas, oil, or water company threatened to shut off services in your home?: No  Health Literacy: Not on file    Review of Systems: See HPI, otherwise negative ROS  Physical Exam: BP (!) 154/91   Pulse 65   Temp 98 F (36.7 C) (Temporal)   Resp 20   Ht 5' 10 (1.778 m)   Wt 96.6 kg   SpO2 100%   BMI 30.56 kg/m  General:   Alert, cooperative. Head:  Normocephalic and atraumatic. Respiratory:  Normal work of breathing. Cardiovascular:  NAD  Impression/Plan: Juan Scott is here for cataract surgery.  Risks, benefits, limitations, and alternatives regarding cataract surgery have been reviewed with the patient.  Questions have been answered.  All parties agreeable.   Adine Novak, MD  10/31/2024, 11:11 AM

## 2024-10-31 NOTE — Op Note (Signed)
 OPERATIVE NOTE  Juan Scott [id number] 10/31/2024   PREOPERATIVE DIAGNOSIS:  Nuclear sclerotic cataract right eye.  H25.11   POSTOPERATIVE DIAGNOSIS:    Nuclear sclerotic cataract right eye.     PROCEDURE:  Phacoemusification with posterior chamber intraocular lens placement of the right eye   LENS:   Implant Name Type Inv. Item Serial No. Manufacturer Lot No. LRB No. Used Action  [employer] PanOptix Pro 18.5 Intraocular Lens  [id number] [employer]IGHT[employer]  Right 1 Implanted       Procedures: PHACOEMULSIFICATION, CATARACT, WITH IOL INSERTION 4.03 00:32.1 (Right)  SURGEON:  Adine Novak, MD, MPH  ANESTHESIOLOGIST: Anesthesiologist: Ola Donny BROCKS, MD CRNA: Levy Harvey, CRNA   ANESTHESIA:  Topical with tetracaine  drops augmented with 1% preservative-free intracameral lidocaine .  ESTIMATED BLOOD LOSS: less than 1 mL.   COMPLICATIONS:  None.   DESCRIPTION OF PROCEDURE:  The patient was identified in the holding room and transported to the operating room and placed in the supine position under the operating microscope.  The right eye was identified as the operative eye and it was prepped and draped in the usual sterile ophthalmic fashion.   A 1.0 millimeter clear-corneal paracentesis was made at the 10:30 position. 0.5 ml of preservative-free 1% lidocaine  with epinephrine  was injected into the anterior chamber.  The anterior chamber was filled with viscoelastic.  A 2.4 millimeter keratome was used to make a near-clear corneal incision at the 8:00 position.  A curvilinear capsulorrhexis was made with a cystotome and capsulorrhexis forceps.  Balanced salt solution was used to hydrodissect and hydrodelineate the nucleus.   Phacoemulsification was then used in stop and chop fashion to remove the lens nucleus and epinucleus.  The remaining cortex was then removed using the irrigation and aspiration handpiece. Viscoelastic was then placed into the capsular bag to distend it for lens placement.  A  lens was then injected into the capsular bag.  The remaining viscoelastic was aspirated.   Wounds were hydrated with balanced salt solution.  The anterior chamber was inflated to a physiologic pressure with balanced salt solution.   Intracameral vigamox  0.1 mL undiluted was injected into the eye and a drop placed onto the ocular surface.  No wound leaks were noted.  The patient was taken to the recovery room in stable condition without complications of anesthesia or surgery  Adine Novak 10/31/2024, 11:44 AM

## 2024-11-04 ENCOUNTER — Encounter: Payer: Self-pay | Admitting: Ophthalmology

## 2024-11-22 ENCOUNTER — Encounter: Payer: Self-pay | Admitting: Ophthalmology
